# Patient Record
Sex: Male | Born: 1975 | Race: White | Hispanic: No | Marital: Married | Smoking: Current some day smoker
Health system: Southern US, Community
[De-identification: ages and names within clinical notes are randomized; demographics above are authoritative.]

## PROBLEM LIST (undated history)

## (undated) DIAGNOSIS — F419 Anxiety disorder, unspecified: Secondary | ICD-10-CM

## (undated) DIAGNOSIS — J45909 Unspecified asthma, uncomplicated: Secondary | ICD-10-CM

## (undated) DIAGNOSIS — F329 Major depressive disorder, single episode, unspecified: Secondary | ICD-10-CM

## (undated) DIAGNOSIS — H409 Unspecified glaucoma: Secondary | ICD-10-CM

## (undated) DIAGNOSIS — F32A Depression, unspecified: Secondary | ICD-10-CM

## (undated) HISTORY — DX: Major depressive disorder, single episode, unspecified: F32.9

## (undated) HISTORY — DX: Depression, unspecified: F32.A

## (undated) HISTORY — DX: Unspecified asthma, uncomplicated: J45.909

---

## 1998-10-18 ENCOUNTER — Emergency Department (HOSPITAL_COMMUNITY): Admission: EM | Admit: 1998-10-18 | Discharge: 1998-10-18 | Payer: Self-pay | Admitting: Emergency Medicine

## 1999-06-03 ENCOUNTER — Encounter: Admission: RE | Admit: 1999-06-03 | Discharge: 1999-06-03 | Payer: Self-pay | Admitting: Family Medicine

## 1999-06-18 ENCOUNTER — Encounter: Admission: RE | Admit: 1999-06-18 | Discharge: 1999-06-18 | Payer: Self-pay | Admitting: Family Medicine

## 2000-08-29 ENCOUNTER — Emergency Department (HOSPITAL_COMMUNITY): Admission: EM | Admit: 2000-08-29 | Discharge: 2000-08-29 | Payer: Self-pay | Admitting: Emergency Medicine

## 2000-09-08 ENCOUNTER — Emergency Department (HOSPITAL_COMMUNITY): Admission: EM | Admit: 2000-09-08 | Discharge: 2000-09-08 | Payer: Self-pay | Admitting: Emergency Medicine

## 2001-05-08 ENCOUNTER — Inpatient Hospital Stay (HOSPITAL_COMMUNITY): Admission: EM | Admit: 2001-05-08 | Discharge: 2001-05-18 | Payer: Self-pay | Admitting: *Deleted

## 2001-05-21 ENCOUNTER — Inpatient Hospital Stay (HOSPITAL_COMMUNITY): Admission: EM | Admit: 2001-05-21 | Discharge: 2001-05-23 | Payer: Self-pay | Admitting: *Deleted

## 2003-07-07 ENCOUNTER — Emergency Department (HOSPITAL_COMMUNITY): Admission: EM | Admit: 2003-07-07 | Discharge: 2003-07-08 | Payer: Self-pay | Admitting: Emergency Medicine

## 2004-04-02 ENCOUNTER — Emergency Department (HOSPITAL_COMMUNITY): Admission: EM | Admit: 2004-04-02 | Discharge: 2004-04-02 | Payer: Self-pay | Admitting: Emergency Medicine

## 2005-04-26 ENCOUNTER — Emergency Department (HOSPITAL_COMMUNITY): Admission: EM | Admit: 2005-04-26 | Discharge: 2005-04-26 | Payer: Self-pay | Admitting: Emergency Medicine

## 2008-12-20 ENCOUNTER — Emergency Department: Payer: Self-pay | Admitting: Internal Medicine

## 2009-07-21 ENCOUNTER — Emergency Department: Payer: Self-pay | Admitting: Emergency Medicine

## 2010-03-31 ENCOUNTER — Emergency Department (HOSPITAL_COMMUNITY): Admission: EM | Admit: 2010-03-31 | Discharge: 2010-03-31 | Payer: Self-pay | Admitting: Emergency Medicine

## 2010-05-06 ENCOUNTER — Ambulatory Visit: Payer: Self-pay | Admitting: Family Medicine

## 2010-05-06 DIAGNOSIS — R634 Abnormal weight loss: Secondary | ICD-10-CM | POA: Insufficient documentation

## 2010-05-06 DIAGNOSIS — J019 Acute sinusitis, unspecified: Secondary | ICD-10-CM | POA: Insufficient documentation

## 2010-05-07 ENCOUNTER — Encounter: Payer: Self-pay | Admitting: Family Medicine

## 2010-05-07 LAB — CONVERTED CEMR LAB
Albumin: 5 g/dL (ref 3.5–5.2)
Alkaline Phosphatase: 50 units/L (ref 39–117)
BUN: 7 mg/dL (ref 6–23)
Basophils Absolute: 0 10*3/uL (ref 0.0–0.1)
Basophils Relative: 0 % (ref 0–1)
Calcium: 10 mg/dL (ref 8.4–10.5)
Creatinine, Ser: 0.74 mg/dL (ref 0.40–1.50)
Eosinophils Absolute: 0.1 10*3/uL (ref 0.0–0.7)
Eosinophils Relative: 1 % (ref 0–5)
Glucose, Bld: 95 mg/dL (ref 70–99)
HCT: 45.4 % (ref 39.0–52.0)
Hemoglobin: 15.2 g/dL (ref 13.0–17.0)
Lymphocytes Relative: 26 % (ref 12–46)
MCHC: 33.5 g/dL (ref 30.0–36.0)
MCV: 100 fL (ref 78.0–100.0)
Monocytes Absolute: 0.4 10*3/uL (ref 0.1–1.0)
Platelets: 243 10*3/uL (ref 150–400)
Potassium: 4.7 meq/L (ref 3.5–5.3)
RDW: 13.7 % (ref 11.5–15.5)

## 2010-05-12 ENCOUNTER — Ambulatory Visit: Payer: Self-pay | Admitting: Family Medicine

## 2010-07-28 ENCOUNTER — Emergency Department (HOSPITAL_COMMUNITY): Admission: EM | Admit: 2010-07-28 | Discharge: 2010-07-28 | Payer: Self-pay | Admitting: Emergency Medicine

## 2010-08-03 ENCOUNTER — Emergency Department: Payer: Self-pay | Admitting: Emergency Medicine

## 2011-01-28 NOTE — Letter (Signed)
Summary: Out of Work  [facility]  [address]   [place], [place] [address]   Phone: [phone]  Fax: [phone]    May 06, 2010   Employee:  LEA WALBERT    To Whom It May Concern:   For Medical reasons, please excuse the above named employee from work for the following dates:  Start:   05/06/2010  Return 05/08/2010:    If you need additional information, please feel free to contact our office.         Sincerely,    Hassan Rowan MD

## 2011-01-28 NOTE — Assessment & Plan Note (Signed)
Summary: DIARREAH, VOMITING, WEAKNESS   Vital Signs:  Patient Profile:   35 Years Old Male CC:      No appetite, diarrhea, nausea, congestion, vomiting, shaking x 1 month Height:     72 inches Weight:      190 pounds O2 Sat:      99 % O2 treatment:    Room Air Temp:     96.9 degrees F oral Pulse rate:   79 / minute Pulse rhythm:   regular Resp:     18 per minute BP sitting:   142 / 83  (right arm) Cuff size:   regular  Vitals Entered By: Emilio Math (May 06, 2010 12:09 PM)             CBG Result 119      Prior Medication List:  No prior medications documented  Current Allergies: No known allergies History of Present Illness Chief Complaint: No appetite, diarrhea, nausea, congestion, vomiting, shaking x 1 month History of Present Illness: Patient reports  not eating gfor the last month . Having  nausea and diarrhea for the last 2 days which is worse. He also reports having shaking and chills as well. He reports having lots of stress.   GM died of unkown cancer.  Patient does report feeliing depressed.  Does not have a regular doctor.  Not sleeping well at all. No intrest in day to day activities.  Has had increase congestion and gagging last 2 days.  Current Problems: WEIGHT LOSS, RECENT (ICD-783.21) DEPRESSION (ICD-311) ACUTE SINUSITIS, UNSPECIFIED (ICD-461.9) UPPER RESPIRATORY INFECTION, ACUTE (ICD-465.9)   Current Meds AUGMENTIN 875-125 MG TABS (AMOXICILLIN-POT CLAVULANATE) 1 by mouth 2 times daily ZYRTEC-D ALLERGY & CONGESTION 5-120 MG XR12H-TAB (CETIRIZINE-PSEUDOEPHEDRINE) sig 1 by mouth 2x a day * LEXAPRO 20 sig 1 by mouth q day PROMETHAZINE HCL 25 MG  TABS (PROMETHAZINE HCL) sig 1 by mouth q 6-8hrs as needed  REVIEW OF SYSTEMS Constitutional Symptoms       Complains of weight loss and fatigue.     Denies fever, chills, night sweats, and weight gain.      Comments: 30lbs in 1 month Eyes       Denies change in vision, eye pain, eye discharge, glasses,  contact lenses, and eye surgery. Ear/Nose/Throat/Mouth       Complains of sinus problems.      Denies hearing loss/aids, change in hearing, ear pain, ear discharge, dizziness, frequent runny nose, frequent nose bleeds, sore throat, hoarseness, and tooth pain or bleeding.  Respiratory       Denies dry cough, productive cough, wheezing, shortness of breath, asthma, bronchitis, and emphysema/COPD.  Cardiovascular       Denies murmurs, chest pain, and tires easily with exhertion.    Gastrointestinal       Complains of nausea/vomiting and diarrhea.      Denies stomach pain, constipation, blood in bowel movements, and indigestion. Genitourniary       Denies painful urination, kidney stones, and loss of urinary control. Neurological       Denies paralysis, seizures, and fainting/blackouts.      Comments: Shaking Musculoskeletal       Denies muscle pain, joint pain, joint stiffness, decreased range of motion, redness, swelling, muscle weakness, and gout.  Skin       Denies bruising, unusual mles/lumps or sores, and hair/skin or nail changes.  Psych       Complains of anxiety/stress and depression.      Denies mood changes, temper/anger  issues, speech problems, and sleep problems. Other Comments: denies any pain he has been drinking lots of tea and occasional Mountain Dew   Past History:  Family History: Last updated: 05/06/2010 Mother, PE Father, Healthy  Social History: Last updated: 05/06/2010 1 ppd, 19 yrs No ETOH Occasional Marijuana AC  Past Medical History: Unremarkable  Past Surgical History: Denies surgical history  Family History: Reviewed history and no changes required. Mother, PE Father, Healthy  Social History: Reviewed history and no changes required. 1 ppd, 19 yrs No ETOH Occasional Marijuana AC Physical Exam General appearance: well developed, well nourished,   sad affect Head: normocephalic, atraumatic Ears: normal, no lesions or deformities Nasal:  pale, boggy, swollen nasal turbinates Oral/Pharynx: tongue normal, posterior pharynx without erythema or exudate Neck: neck supple,  trachea midline, no masses Chest/Lungs: no rales, wheezes, or rhonchi bilateral, breath sounds equal without effort Heart: regular rate and  rhythm, no murmur Abdomen: soft, non-tender without obvious organomegaly Skin: no obvious rashes or lesions MSE: oriented to time, place, and person                      Assessment New Problems: WEIGHT LOSS, RECENT (ICD-783.21) DEPRESSION (ICD-311) ACUTE SINUSITIS, UNSPECIFIED (ICD-461.9) UPPER RESPIRATORY INFECTION, ACUTE (ICD-465.9)  siniusitis  nausea and vomiting  weight loss  Patient Education: Patient and/or caregiver instructed in the following: rest fluids and Tylenol.  Plan New Medications/Changes: PROMETHAZINE HCL 25 MG  TABS (PROMETHAZINE HCL) sig 1 by mouth q 6-8hrs as needed  #12 x 0, 05/06/2010, Hassan Rowan MD LEXAPRO 20 sig 1 by mouth q day  #30 x 0, 05/06/2010, Hassan Rowan MD ZYRTEC-D ALLERGY & CONGESTION 5-120 MG XR12H-TAB (CETIRIZINE-PSEUDOEPHEDRINE) sig 1 by mouth 2x a day  #30 x 0, 05/06/2010, Hassan Rowan MD AUGMENTIN [phone] MG TABS (AMOXICILLIN-POT CLAVULANATE) 1 by mouth 2 times daily  #20 x 0, 05/06/2010, Hassan Rowan MD  New Orders: New Patient Level IV [99204] T-Comprehensive Metabolic Panel [[id number]] T-CBC w/Diff [[id number]] T-TSH [[id number]] Follow Up: Follow up in 1-2 days if no improvement, Follow up with Primary Physician Work/School Excuse: Return to work/school in 2 days  The patient and/or caregiver has been counseled thoroughly with regard to medications prescribed including dosage, schedule, interactions, rationale for use, and possible side effects and they verbalize understanding.  Diagnoses and expected course of recovery discussed and will return if not improved as expected or if the condition worsens. Patient and/or caregiver verbalized  understanding.  Prescriptions: PROMETHAZINE HCL 25 MG  TABS (PROMETHAZINE HCL) sig 1 by mouth q 6-8hrs as needed  #12 x 0   Entered and Authorized by:   Hassan Rowan MD   Signed by:   Hassan Rowan MD on 05/06/2010   Method used:   Print then Give to Patient   RxID:   [id number] LEXAPRO 20 sig 1 by mouth q day  #30 x 0   Entered and Authorized by:   Hassan Rowan MD   Signed by:   Hassan Rowan MD on 05/06/2010   Method used:   Print then Give to Patient   RxID:   [id number] ALLERGY & CONGESTION 5-120 MG XR12H-TAB (CETIRIZINE-PSEUDOEPHEDRINE) sig 1 by mouth 2x a day  #30 x 0   Entered and Authorized by:   Hassan Rowan MD   Signed by:   Hassan Rowan MD on 05/06/2010   Method used:   Print then Give to Patient   RxID:   [id number] AUGMENTIN 875-125 MG TABS (AMOXICILLIN-POT CLAVULANATE) 1  by mouth 2 times daily  #20 x 0   Entered and Authorized by:   Hassan Rowan MD   Signed by:   Hassan Rowan MD on 05/06/2010   Method used:   Print then Give to Patient   RxID:   [id number]   Patient Instructions: 1)  Please schedule an appointment with your primary doctor in :1-2 weeks 2)  Will give referal to FP group in this bulding  may need more extensive testing to evaluate weight loss 3)  Drink clear liquids only for the next 24 hours, then slowly add other liquids and food as you  tolerate them. 4)  Tobacco is very bad for your health and your loved ones! You Should stop smoking!. 5)  Stop Smoking Tips: Choose a Quit date. Cut down before the Quit date. decide what you will do as a substitute when you feel the urge to smoke(gum,toothpick,exercise). 6)  You need to lose weight. Consider a lower calorie diet and regular exercise.  7)  Take your antibiotic as prescribed until ALL of it is gone, but stop if you develop a rash or swelling and contact our office as soon as possible. 8)  Acute sinusitis symptoms for less than 10 days are not helped by antibiotics.Use warm moist  compresses, and over the counter decongestants ( only as directed). Call if no improvement in 5-7 days, sooner if increasing pain, fever, or new symptoms. 9)  Recommended remaining out of work for next 2 days

## 2011-03-17 ENCOUNTER — Inpatient Hospital Stay (INDEPENDENT_AMBULATORY_CARE_PROVIDER_SITE_OTHER)
Admission: RE | Admit: 2011-03-17 | Discharge: 2011-03-17 | Disposition: A | Discharge status: Home / Self Care | Payer: Medicaid Other | Source: Ambulatory Visit | Attending: Emergency Medicine | Admitting: Emergency Medicine

## 2011-03-17 ENCOUNTER — Encounter: Payer: Self-pay | Admitting: Emergency Medicine

## 2011-03-17 DIAGNOSIS — M25519 Pain in unspecified shoulder: Secondary | ICD-10-CM | POA: Insufficient documentation

## 2011-03-18 ENCOUNTER — Encounter: Payer: Self-pay | Admitting: Emergency Medicine

## 2011-03-25 NOTE — Letter (Signed)
Summary: Internal Correspondence  Internal Correspondence   Imported By: Dannette Barbara 03/18/2011 08:44:54  _____________________________________________________________________  External Attachment:    Type:   Image     Comment:   External Document

## 2011-03-25 NOTE — Letter (Signed)
Summary: External Correspondence  External Correspondence   Imported By: Dannette Barbara 03/18/2011 08:45:58  _____________________________________________________________________  External Attachment:    Type:   Image     Comment:   External Document

## 2011-03-25 NOTE — Assessment & Plan Note (Signed)
Summary: SICK Rm 5   Vital Signs:  Patient Profile:   35 Years Old Male CC:      Left shoulder pain x 3 days Height:     72 inches Weight:      190 pounds O2 Sat:      98 % O2 treatment:    Room Air Temp:     98.3 degrees F oral Pulse rate:   75 / minute Pulse rhythm:   regular Resp:     12 per minute BP sitting:   124 / 80  (left arm) Cuff size:   regular  Pt. in pain?   yes    Location:   shoulder    Intensity:   8    Type:       burning  Vitals Entered By: Emilio Math (March 17, 2011 5:07 PM)                   Current Allergies: No known allergies History of Present Illness Chief Complaint: Left shoulder pain x 3 days History of Present Illness: L shoulder pain for 3 days.  Located posterior back near shoulder blade.  Doesn't recall any trauma, falls, heavy lifting.  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  Also has 1 1/2 old daughter who he frequently picks up.  No radiation of pain.  Never had shoulder pain before.  REVIEW OF SYSTEMS Constitutional Symptoms      Denies fever, chills, night sweats, weight loss, weight gain, and fatigue.  Eyes       Denies change in vision, eye pain, eye discharge, glasses, contact lenses, and eye surgery. Ear/Nose/Throat/Mouth       Denies hearing loss/aids, change in hearing, ear pain, ear discharge, dizziness, frequent runny nose, frequent nose bleeds, sinus problems, sore throat, hoarseness, and tooth pain or bleeding.  Respiratory       Denies dry cough, productive cough, wheezing, shortness of breath, asthma, bronchitis, and emphysema/COPD.  Cardiovascular       Denies murmurs, chest pain, and tires easily with exhertion.    Gastrointestinal       Denies stomach pain, nausea/vomiting, diarrhea, constipation, blood in bowel movements, and indigestion. Genitourniary       Denies painful urination, kidney stones, and loss of urinary control. Neurological       Denies paralysis,  seizures, and fainting/blackouts. Musculoskeletal       Complains of muscle pain, joint pain, joint stiffness, and decreased range of motion.      Denies redness, swelling, muscle weakness, and gout.  Skin       Denies bruising, unusual mles/lumps or sores, and hair/skin or nail changes.  Psych       Denies mood changes, temper/anger issues, anxiety/stress, speech problems, depression, and sleep problems.  Past History:  Past Medical History: Reviewed history from 05/06/2010 and no changes required. Unremarkable  Past Surgical History: Reviewed history from 05/06/2010 and no changes required. Denies surgical history  Family History: Reviewed history from 05/06/2010 and no changes required. Mother, PE Father, Healthy  Social History: Reviewed history from 05/06/2010 and no changes required. 1 ppd, 19 yrs No ETOH Occasional Marijuana AC Physical Exam General appearance: well developed, well nourished, no acute distress MSE: oriented to time, place, and person L Shoulder: Inspection reveals spasms medial and superior to L scapula.  Palpation with tenderness over levator scapulae and rhomboid muscles. No tenderness over AC, [place], bicipital groove, acromion, and coracoid,  or his scapula bone.  ROM is full in all planes. Rotator cuff strength normal throughout. Mild scapular dyskinesis.No wasting.  Spurling negative.  Distal NV status intact. Assessment New Problems: SHOULDER PAIN, LEFT (ICD-719.41)  Scapular dysfunction secondary to rhomboid strain/spasm  Patient Education: Patient and/or caregiver instructed in the following: rest, fluids.  Plan New Medications/Changes: TRAMADOL HCL 50 MG TABS (TRAMADOL HCL) 1 by mouth q 6 hrs as needed pain  #24 x 0, 03/17/2011, Hoyt Koch MD PREDNISONE (PAK) 10 MG TABS (PREDNISONE) 6 day pack, use as directed  #1 x 0, 03/17/2011, Hoyt Koch MD FLEXERIL 10 MG TABS (CYCLOBENZAPRINE HCL) 1 by mouth three times a day as needed  spasms  #15 x 0, 03/17/2011, Hoyt Koch MD  New Orders: Est. Patient Level IV [47829] Planning Comments:   Take meds as directed.  Heating pad encouraged.  Gentle massage and gentle ROM.  Avoid heavy lifting or overhead activities.  Would adjust his computer and chair so that he can type comfortably.  I have informed him that it will take at least a few days to start improving, but If not improving in a week, refer to sports med or back ortho. Due to having Medicaid, hold off on PT for now since has only a small limited number of sessions per year.   The patient and/or caregiver has been counseled thoroughly with regard to medications prescribed including dosage, schedule, interactions, rationale for use, and possible side effects and they verbalize understanding.  Diagnoses and expected course of recovery discussed and will return if not improved as expected or if the condition worsens. Patient and/or caregiver verbalized understanding.  Prescriptions: TRAMADOL HCL 50 MG TABS (TRAMADOL HCL) 1 by mouth q 6 hrs as needed pain  #24 x 0   Entered and Authorized by:   Hoyt Koch MD   Signed by:   Hoyt Koch MD on 03/17/2011   Method used:   Print then Give to Patient   RxID:   [id number] PREDNISONE (PAK) 10 MG TABS (PREDNISONE) 6 day pack, use as directed  #1 x 0   Entered and Authorized by:   Hoyt Koch MD   Signed by:   Hoyt Koch MD on 03/17/2011   Method used:   Print then Give to Patient   RxID:   [id number] FLEXERIL 10 MG TABS (CYCLOBENZAPRINE HCL) 1 by mouth three times a day as needed spasms  #15 x 0   Entered and Authorized by:   Hoyt Koch MD   Signed by:   Hoyt Koch MD on 03/17/2011   Method used:   Print then Give to Patient   RxID:   [id number]   Orders Added: 1)  Est. Patient Level IV [38756]

## 2011-05-14 NOTE — H&P (Signed)
[facility]  Patient:    [name], [name]                       MRN: [id number] Adm. Date:  [id number] Attending:  Jasmine Pang Dictator:   Candi Leash. Orsini, N.P.                         History and Physical  HISTORY OF PRESENT ILLNESS:  The patient was admitted on May 08, 2001, for depression and suicidal ideation.  REVIEW OF SYSTEMS:  Over the past year, the patient reports many somatic complaints, having fever and chills for about a year, having change in his appetite where sometimes he does not eat at all during the day.  He states he makes sure that his children are fed first before he eats, although he does report no weight gain or weight loss.  He states he feels like his "words blend together."  It has been a long time since he has had an eye exam.  He believes he needs glasses.  He states he is constantly sick, having a lot of colds and sore throats.  He also reports some chest pain two to three times a day that last about an hour, stabbing type chest pain with some numbness going down both his arms.  He states he has never had a workup.  Respiratory: He smokes.  He states he has a cough and some shortness of breath.  He feels like he cannot breathe.  GI: He reports every time he eats he needs to lie down because he is having abdominal pain.  He says he likes to eat a lot of greasy foods.  He denies any blood in his stool or changes in habits, constipation or diarrhea.  No dysuria, frequency, or hematuria.  Musculoskeletal: He reports his hands "lock up, excruciating pain" when he is trying to hammer; he can only do it for a short time, about 10 minutes.  He needs to sit down.  Again, he states has never had any evaluation in regard to that.  Skin: He states he takes a long time to heal.  He denies any bruising or rashes.  Neurologic: He reports a history of migraine headaches for about a year, no memory loss or seizures.  The patient also  states that he also feels like he gets dizzy all the time.  Psychiatric: History of depression since separation from his wife. Denies any thyroid problems.  Lymph: No enlarged or tender nodes or anemia. Allergies: No environmental allergies.  PHYSICAL EXAMINATION:  Vital signs: Weight 159 pounds, height 6 feet 2 inches, temperature 97.9, pulse 74, respirations 20, blood pressure 135/80.  General: A 35 year old separated Caucasian male sitting on the exam table in no acute distress.  Appears well-developed and appears his stated age.  Neat, alert, and cooperative.  Head is normocephalic, can raise eyebrows.  Eyes: Pupils equal and reactive to light.  EOMs are intact bilaterally.  Funduscopic exam is within normal limits.  External ear canals are patent.  TMs are intact. Hearing is adequate for conversation.  No sinus tenderness, no nasal discharge.  Mouth: Mucosa is moist, fair to poor dentition, has some rotten teeth in his lower molars.  Tongue protrudes to midline without tremor.  Can clench teeth and puff out cheeks.  No pharyngeal exudate, pharynx appears within normal limits.  Uvula is midline.  Neck is supple, full range of  motion, no JVD, negative lymphadenopathy.  Thyroid is nonpalpable and nontender.  Trachea is midline.  Respiratory: Clear to auscultation, no adventitious sounds, no cough was noted.  Cardiac: Regular rate and rhythm without murmurs, gallops, or rubs.  Carotid pulses are equal and adequate, no carotid bruits auscultated.  Mild left edema from an injury to his left calf within about a week.  Abdomen: Flat, soft, nontender abdomen, no masses or organomegaly, active bowel sounds, no CVA tenderness.  Musculoskeletal: No joint swelling or deformity, good range of motion, muscle strength and tone is equal bilaterally.  ACE wrap to the patients left lower calf, good pedal pulse, toes are nice and warm, some mild ankle swelling there.  Skin is warm and dry, good turgor.   Nail beds are pink with good capillary refill. Neurologic: Oriented x 3.  Cranial nerves are grossly intact.  Deep tendon reflexes are 2+, equal, and adequate.  Good grip strength bilaterally, no involuntary movement.  Gait: The patient presents with a limp from his injury to his left calf.  Cerebellar function is intact with finger-to-finger and normal alternating movements.  Romberg is negative.  Health maintenance issues were addressed in regard to eye exams, dental care, nutrition, and smoking cessation. DD:  05/10/01 TD:  05/10/01 Job: [id number]
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
## 2011-05-14 NOTE — H&P (Signed)
[facility]  Patient:    [name], [name]                       MRN: [id number] Adm. Date:  [id number] Disc. Date: [id number] Attending:  Shelba Flake Dictator:   Candi Leash. Orsini, N.P.                   Psychiatric Admission Assessment  IDENTIFYING INFORMATION:  This 35 year old separated white male was admitted on May 26 for depression and suicide ideation.  HISTORY OF PRESENT ILLNESS:  The patient presents with a history of recurrent depression and suicide ideation since discharge on Thursday, May 19, 2001.  DISCHARGE PLANS:  The patient was to live apparently with friends after discharge, but they had told him that they would be out of town until Monday, so the patient had to go back and live with his mother.  The patient states that he had an argument with her.  He would not specify as to what it was about.  He went and took 15-20 Advil on Thursday around 10 p.m. only to relieve his toothache pain, not as a suicide attempt.  The patient states he slept.  The next morning he went to go visit some friends.  The patient has been socializing with two patients that he met at [facility] with this last hospitalization and has been with them over the weekend.  He did tell one friend on Sunday morning that he was still having thoughts of hurting himself with his plan to make his death look like "an accident."  The patient had no specific plan in regards to that.  The patient states he took four aspirin and two Valium that morning in an attempt to help with his toothache pain, not as an overdose.  He does report his sleep has been decreased.  He has been having nightmares, decreased appetite.  He states he has had one meal over the past four days.  His children are presently with his mother.  PAST PSYCHIATRIC HISTORY:  In 1995 he was in the step one program, in [facility] in May of 2001, and he had a 10-day hospital stay at [facility] from May 13 to May 23 for depression.  SOCIAL HISTORY:  He is a 35 year old separated white male, has been separated for one year.  He has been married for six years.  He has three children, ages 3, 41, and 2.  He has sole custody of the children, as his wife is a drug addict.  He has been unemployed for the past eight months.  He has completed his GED.  He has been attending alcohol assessment classes for DUI in September 2001.  FAMILY HISTORY:  Father is alcoholic.  ALCOHOL AND DRUG HISTORY:  He smokes two packs a day.  He denies any alcohol abuse.  He states he used marijuana over the weekend.  He states he did not actually go out and get it; it was there.  He denies any other substance abuse.  PRIMARY CARE Shaterria Sager:  Dr. Toni Arthurs.  MEDICAL PROBLEMS:  None.  MEDICATIONS:  The patient is uncertain of his dosages, but states he is on Trazodone, Celexa, Depakote, and Risperdal.  He states he has been very dizzy with the medicine and has had several falls that he relates to this medication.  DRUG ALLERGIES:  No known drug allergies.  PHYSICAL EXAMINATION:  Physical exam was performed at the  last hospitalization, also at emergency room visit at [facility].  His BUN was less than 5.  Urinalysis within normal limits.  His urine drug screen was positive for THC, positive for benzodiazepines.  The patient is presently complaining of a toothache.  MENTAL STATUS EXAMINATION:  He is an alert, young, developed, Caucasian male. He is dressed casually, cooperative.  Speech is normal and relevant.  Mood is depressed.  Affect is appropriate to mood.  Thought processes are coherent. There is no evidence of psychosis, no current suicide ideation.  He promises safety.  No homicidal ideation, no paranoid, no auditory or visual hallucinations.  Cognitive functioning is intact.  Memory is fair.  Judgment is impaired.  Insight is poor.  He has poor impulse control.  DIAGNOSES: Axis I:     1. Major depression, recurrent.            2. Cannabis abuse. Axis II:   Deferred. Axis III:  None. Axis IV:   Moderate, problems relating to primary support group, occupation,            housing. Axis V:    Current is 35, this past year is 70.  PLAN:  Voluntary admission to [facility] for depression and suicide ideation, contract for safety, check every 15 minutes.  The patient agrees to be safe.  He will resume his routine medications, will obtain labs, urine drug screen, and CBC.  Our plan is to return him to his arrangement that he made with his last discharge and to decrease his depressive symptoms so the patient can be safe.  We plan to increase his coping skills so the patient can manage these situations.  Tentative length of stay is 3-5 days. DD:  05/22/01 TD:  05/22/01 Job: [id number]

## 2011-05-14 NOTE — Discharge Summary (Signed)
[facility]  Patient:    [name], [name]                       MRN: [id number] Adm. Date:  [id number] Disc. Date: [id number] Attending:  Geoffery Lyons A Dictator:   Candi Leash. Orsini, N.P.                           Discharge Summary  HISTORY OF PRESENT ILLNESS:  This is a 35 year old separated white male who was admitted for depression and suicidal ideation.  The patient reports he has been having his recurrent depressive symptoms and suicidal ideation since his discharge on Thursday, May 19, 2001.  The patient apparently was to live with his friends after discharge but they told him that they would be unavailable until Monday of Memorial Day. The patient states he had gone back to live with his mother and had a conflict and then went to stay with some friends.  The patient reportedly took 15 to 20 Advils on Thursday at 1:10 p.m. to relieve some toothache pain, not as a suicide attempt.  Over the weekend he has been socializing with a couple of patients that he had met at [facility] that he had gotten close to.  He reported that on Sunday morning he was having thoughts of hurting himself and his plan was to make his death look like "an accident."  He had no specific plan in mind though. The patient did take four aspirin and two Valium on day of admission only to relieve toothache pain, not as an overdose.  He reports his sleep has been decreased, he has been having nightmares, and decreased appetite.  He states he has eaten only one meal over the past four days.  PAST PSYCHIATRIC HISTORY:  The patient had a 10-day hospital stay at [facility] on May 08, 2001, and discharged on May 18, 2001, for depression and suicidal ideation.  He has also had other hospitalizations in [facility] with an inpatient stay in May 2001 and in 1995 at [facility].  PAST MEDICAL HISTORY:  Primary care [name] is Onalee Hua L. Toni Arthurs, [name]  He has no medical  problems.  DISCHARGE MEDICATIONS:  Trazodone, Celexa, Depakote and Risperdal.  ALLERGIES:  No known drug allergies.  PHYSICAL EXAMINATION:  This was performed on his last hospitalization.  LABS THIS ADMISSION:  BUN less than 5.  Urinalysis is within normal limits. Urine drug screen was positive for THC and positive for benzodiazepines.  MENTAL STATUS EXAMINATION:  He is an alert, young, well-developed Caucasian male.  He is dressed casually but he is very cooperative. Speech is normal and relevant.  Mood is depressed.  Affect is appropriate to mood.  Thought processes are coherent.  There is no evidence of psychosis. There is no current homicidal or suicidal ideations.  No auditory or visual hallucinations, no paranoid ideation.  Cognitive function is intact.  Memory is fair.  Judgement is impaired.  Insight is poor.  Poor impulse control.  ADMISSION DIAGNOSES: AXIS I.   1. Major depression, recurrent.           2. Cannabis abuse. AXIS II.  Deferred. AXIS III. None. AXIS IV.  Moderate psychosocial stressors related to primary support group,           occupation and housing. AXIS V.   Current is 48, this past year was 70.  HOSPITAL COURSE:  This was a voluntary admission for depression and suicidal ideation.  The patient will be monitored every 15 minutes.  The patient does agree to be safe on the unit.  Will resume his routine medications and obtain further labs.  Our intention was to have the patient return to his friends for living arrangements and to decrease his depressive symptoms so the patient could be safe.  We increased his Celexa to 60 mg per day, hoping to decrease his depressive symptoms.  The patient was tolerating that well.  The patient did have an episode of some anxiety and toothache pain and was given some Ativan and Darvocet.  CONDITION ON DISCHARGE:  The patient was in full contact with reality.  He was much improved.  He reported that he felt overwhelmed  when he was discharged. The patient will be discharged to stay with his parents and reports that the relationship had improved and he will follow up with [facility].  It was felt that the patient could be managed on an outpatient basis.  He was denying any homicidal or suicidal ideations and will be discharged with his parents.  FOLLOW-UP:  The patient will follow up at [facility] in [place], [place]. An appointment was made on May 26, 2001, at 11 a.m. in the hospital reentry.  Address was provided to the patient.  ACTIVITY:  No restrictions.  DIET:  No restrictions.  DISCHARGE MEDICATIONS: 1. Cogentin 1 mg b.i.d. 2. Celexa 60 mg q.d. 3. Risperdal 0.25 mg one b.i.d. 4. Depakote ER 500 mg q.h.s. 5. Darvocet-N 100 one every eight hours p.r.n. for pain.  A prescription for    12 is provided.  DISCHARGE DIAGNOSES: AXIS I.   1. Major depression, recurrent.           2. Cannabis abuse. AXIS II.  Deferred. AXIS III. None. AXIS IV.  Moderate psychosocial stressors. AXIS V.   Current is 51, this past year was 70. DD:  05/23/01 TD:  05/23/01 Job: [id number]

## 2011-05-14 NOTE — H&P (Signed)
[facility]  Patient:    [name], [name]                       MRN: [id number] Adm. Date:  [id number] Attending:  Jasmine Pang Dictator:   Candi Leash. Orsini, N.P.                         History and Physical  HISTORY OF PRESENT ILLNESS:  The patient was admitted on May 08, 2001, for depression and suicidal ideation.  REVIEW OF SYSTEMS:  The patient reports having many different somatic complaints, especially over the past year when he separated from his wife.  He reports he has been having some fever and chills for about a year with changes in his appetite.  He states he makes sure that his children are fed first then he eats, sometimes not eating at all on a daily basis but he reports no weight change.  He reports some difficulty with his vision, he feels like the words blend together.  He states he has never had an eye exam, maybe when he had his last drivers license test. DD:  05/10/01 TD:  05/10/01 Job: [id number]

## 2011-05-14 NOTE — H&P (Signed)
[facility]  Patient:    [name], [name]                       MRN: [id number] Adm. Date:  [id number] Attending:  Milford Cage H Dictator:   Landry Corporal, N.P.                   Psychiatric Admission Assessment  IDENTIFYING INFORMATION:  This is a 35 year old separated white male voluntarily admitted on May 07, 2001, for depression and suicidal ideation.  HISTORY OF PRESENT ILLNESS:  Patient presents with a history of depression, feeling very helpless, overwhelmed, with no energy and anhedonia.  Patient has been having suicidal ideation for several weeks.  Patient did overdose on Effexor 36 tablets on Saturday night.  Patient stated he was having seizures during the night, totalling 5.  Patient did go to his family medical doctor about a month ago for depressive symptoms and was placed on Effexor at that time.  Patient called his primary care Shariece Viveiros after the overdose and she had advised him to go to [facility].  Patient reports he has been having significant stressors in his life, separation from his wife.  He states his wife has been sleeping with all of his friends.  She calls him at home and talks about her new boyfriend.  Patient is also on unemployment.  He lost his apartment from inability to pay his rent.  He also finds he receives little support from his family and friends.  His friends encourage him to use marijuana when patient does not care to do so.  Patient sleeps poorly.  He states he has a decreased appetite, although there is not report of weight loss.  He denies any auditory or visual hallucinations or paranoia.  Patient continues with having problems with suicidal ideation, no specific plan, and will contract for safety on the unit.  Patient also feels somewhat angry about his wife and his boyfriend.  Patient states he wants to get his life back on track but finds he does not even know where to start.  PAST  PSYCHIATRIC HISTORY:  He has been in [facility] in May 2001, and in 1995 was in [facility].  He sees no outpatient therapist or psychiatrist.  SOCIAL HISTORY:  He is a 35 year old separated white male.  He has been separated for 1 year, married for 6 years.  He has 3 children, ages 78, 3 and 2.  Patient lost his job that he states he really enjoyed, because his other coworkers were trying to get him to smoke marijuana where he did not want to. He does have custody of his children.  He states his wife is a drug addict. Because he lost his apartment, he has been living with his mom and dad, has moved there about a week ago.  He has been unemployed for 8 months.  He has completed his GED.  Patient has going 2 days a week for alcohol assessment classes for a DUI in September 2001, and tomorrow he has an appointment for community services.  FAMILY HISTORY:  He has a father who is an alcoholic.  ALCOHOL DRUG HISTORY:  He smokes 2 packs a day.  He denies any alcohol use, and marijuana was used last week.  He states he has no will power when it comes to that.  PAST MEDICAL HISTORY:  Primary care Shia Eber is Dr. Tomi Bamberger.  Medical problems:  He states he  pulled a muscle on his left leg, which occurred Saturday.  Other than that, he states he has no medical problems. Medications:  Patient was Effexor, has been on that for about a month, and overdosed this past weekend.  That was prescribed by Dr. Toni Arthurs.  DRUG ALLERGIES:  No known drug allergies.  PHYSICAL EXAMINATION:  Pending.  Patient appears as a healthy young male.  He does complain of some pain to his left lower leg which he states is a strain.  MENTAL STATUS EXAMINATION:  He is an alert young Caucasian male, dressed neatly, casually dressed, cooperative, good eye contact.  Speech is normal and relevant.  Mood is depressed.  Affect is flat and teary-eyed.  Thought processes are coherent.  There is no evidence of psychosis.  Suicidal  ideation continues, but patient has no plan and will contract for safety.  No homicidal ideation, no auditory or visual hallucinations, no paranoia.  Cognitive function is intact.  His memory is good, judgment is fair, insight is fair, he has poor impulse control.  He appears to be reliable.  ADMISSION DIAGNOSES: Axis I:    1. Major depression.            2. Cannabis abuse. Axis II:   Deferred. Axis III:  None. Axis IV:   Severe, with problems relating to primary support group,            occupation, housing, economics, and problems related to legal            system. Axis V:    Current 30, this past year 21.  INITIAL PLAN OF CARE:  Voluntary admission to [facility] for depression and suicidal ideation, contract for safety, check every 15 minutes. Patient agrees to be safe.  We will initiate an antidepressant to reduce his depressive symptoms.  We will encourage groups.  Patient to elevate and rest his left leg. Plan is to reduce his depressive symptoms so patient can be safe.  TENTATIVE LENGTH OF STAY:  Three to five days. DD:  05/09/01 TD:  05/09/01 Job: [id number]

## 2011-05-20 ENCOUNTER — Inpatient Hospital Stay (INDEPENDENT_AMBULATORY_CARE_PROVIDER_SITE_OTHER)
Admission: RE | Admit: 2011-05-20 | Discharge: 2011-05-20 | Disposition: A | Discharge status: Home / Self Care | Payer: Medicaid Other | Source: Ambulatory Visit | Attending: Family Medicine | Admitting: Family Medicine

## 2011-05-20 ENCOUNTER — Encounter: Payer: Self-pay | Admitting: Family Medicine

## 2011-05-20 DIAGNOSIS — B86 Scabies: Secondary | ICD-10-CM

## 2011-05-20 DIAGNOSIS — J45909 Unspecified asthma, uncomplicated: Secondary | ICD-10-CM | POA: Insufficient documentation

## 2011-05-25 ENCOUNTER — Inpatient Hospital Stay (INDEPENDENT_AMBULATORY_CARE_PROVIDER_SITE_OTHER)
Admission: RE | Admit: 2011-05-25 | Discharge: 2011-05-25 | Disposition: A | Discharge status: Home / Self Care | Payer: Medicaid Other | Source: Ambulatory Visit | Attending: Emergency Medicine | Admitting: Emergency Medicine

## 2011-05-25 ENCOUNTER — Encounter: Payer: Self-pay | Admitting: Emergency Medicine

## 2011-05-25 DIAGNOSIS — L259 Unspecified contact dermatitis, unspecified cause: Secondary | ICD-10-CM

## 2011-06-08 ENCOUNTER — Encounter (INDEPENDENT_AMBULATORY_CARE_PROVIDER_SITE_OTHER): Payer: Self-pay | Admitting: *Deleted

## 2011-06-08 ENCOUNTER — Encounter: Payer: Self-pay | Admitting: Emergency Medicine

## 2011-06-08 ENCOUNTER — Inpatient Hospital Stay (INDEPENDENT_AMBULATORY_CARE_PROVIDER_SITE_OTHER)
Admission: RE | Admit: 2011-06-08 | Discharge: 2011-06-08 | Disposition: A | Discharge status: Home / Self Care | Payer: Medicaid Other | Source: Ambulatory Visit | Attending: Emergency Medicine | Admitting: Emergency Medicine

## 2011-06-08 DIAGNOSIS — R109 Unspecified abdominal pain: Secondary | ICD-10-CM | POA: Insufficient documentation

## 2011-06-08 DIAGNOSIS — K5289 Other specified noninfective gastroenteritis and colitis: Secondary | ICD-10-CM

## 2011-06-08 DIAGNOSIS — R112 Nausea with vomiting, unspecified: Secondary | ICD-10-CM | POA: Insufficient documentation

## 2011-06-08 DIAGNOSIS — K529 Noninfective gastroenteritis and colitis, unspecified: Secondary | ICD-10-CM | POA: Insufficient documentation

## 2011-08-02 ENCOUNTER — Encounter (INDEPENDENT_AMBULATORY_CARE_PROVIDER_SITE_OTHER): Payer: Self-pay | Admitting: *Deleted

## 2011-08-02 ENCOUNTER — Inpatient Hospital Stay: Admission: RE | Admit: 2011-08-02 | Payer: Medicaid Other | Source: Ambulatory Visit | Admitting: Emergency Medicine

## 2011-08-27 DIAGNOSIS — F419 Anxiety disorder, unspecified: Secondary | ICD-10-CM | POA: Insufficient documentation

## 2011-08-27 DIAGNOSIS — R21 Rash and other nonspecific skin eruption: Secondary | ICD-10-CM | POA: Insufficient documentation

## 2011-08-27 DIAGNOSIS — R630 Anorexia: Secondary | ICD-10-CM | POA: Insufficient documentation

## 2011-08-27 DIAGNOSIS — G2581 Restless legs syndrome: Secondary | ICD-10-CM | POA: Insufficient documentation

## 2011-08-27 DIAGNOSIS — G4700 Insomnia, unspecified: Secondary | ICD-10-CM | POA: Insufficient documentation

## 2011-10-11 ENCOUNTER — Encounter: Payer: Self-pay | Admitting: Emergency Medicine

## 2011-10-11 ENCOUNTER — Inpatient Hospital Stay (INDEPENDENT_AMBULATORY_CARE_PROVIDER_SITE_OTHER)
Admission: RE | Admit: 2011-10-11 | Discharge: 2011-10-11 | Disposition: A | Discharge status: Home / Self Care | Payer: Medicaid Other | Source: Ambulatory Visit | Attending: Emergency Medicine | Admitting: Emergency Medicine

## 2011-10-11 ENCOUNTER — Encounter (INDEPENDENT_AMBULATORY_CARE_PROVIDER_SITE_OTHER): Payer: Self-pay | Admitting: *Deleted

## 2011-10-11 DIAGNOSIS — J209 Acute bronchitis, unspecified: Secondary | ICD-10-CM | POA: Insufficient documentation

## 2011-11-29 NOTE — Progress Notes (Signed)
Summary: STOMACH PAIN   Vital Signs:  Patient Profile:   35 Years Old Male CC:      N/V/D x today Height:     72 inches Weight:      184 pounds O2 Sat:      96 % O2 treatment:    Room Air Temp:     98.4 degrees F oral Pulse rate:   81 / minute Resp:     18 per minute BP sitting:   134 / 64  (left arm) Cuff size:   regular  Vitals Entered By: Clemens Catholic LPN (June 08, 2011 6:16 PM)                  Updated Prior Medication List: ALBUTEROL SULFATE (2.5 MG/3ML) 0.083% NEBU (ALBUTEROL SULFATE) prn  Current Allergies (reviewed today): No known allergies History of Present Illness History from: patient Chief Complaint: N/V/D x today History of Present Illness: Abd pain, N/V since 3pm today.  He was told to go home by his boss so he needs a note for today.  Not taking any meds.  He is keeping down fluids [facility]) but threw up his sub.   Nobody else around has been sick.  No F/C.  No URI symptoms. He states that his poison ivy rash is much improved after the steroid shot last week.  REVIEW OF SYSTEMS Constitutional Symptoms      Denies fever, chills, night sweats, weight loss, weight gain, and fatigue.  Eyes       Denies change in vision, eye pain, eye discharge, glasses, contact lenses, and eye surgery. Ear/Nose/Throat/Mouth       Denies hearing loss/aids, change in hearing, ear pain, ear discharge, dizziness, frequent runny nose, frequent nose bleeds, sinus problems, sore throat, hoarseness, and tooth pain or bleeding.  Respiratory       Complains of asthma.      Denies dry cough, productive cough, wheezing, shortness of breath, bronchitis, and emphysema/COPD.  Cardiovascular       Denies murmurs, chest pain, and tires easily with exhertion.    Gastrointestinal       Complains of stomach pain, nausea/vomiting, and diarrhea.      Denies constipation, blood in bowel movements, and indigestion. Genitourniary       Denies painful urination, kidney stones, and loss  of urinary control. Neurological       Denies paralysis, seizures, and fainting/blackouts. Musculoskeletal       Denies muscle pain, joint pain, joint stiffness, decreased range of motion, redness, swelling, muscle weakness, and gout.  Skin       Denies bruising, unusual mles/lumps or sores, and hair/skin or nail changes.  Psych       Denies mood changes, temper/anger issues, anxiety/stress, speech problems, depression, and sleep problems. Other Comments: pt c/o N/V/D and mid abd pain x 3pm today. he also has a rash x 2 mths.   Past History:  Past Medical History: Reviewed history from 05/20/2011 and no changes required.  Asthma  Past Surgical History: Reviewed history from 05/06/2010 and no changes required. Denies surgical history  Family History: Reviewed history from 05/06/2010 and no changes required. Mother, PE Father, Healthy  Social History: Reviewed history from 05/06/2010 and no changes required. 1 ppd, 19 yrs No ETOH Occasional Marijuana AC Physical Exam General appearance: well developed, well nourished, no acute distress Abdomen: ND, NT, +BS, soft, no rebound, no guarding Skin: rash is improved since last visit MSE: oriented to time, place, and  person Assessment New Problems: GASTROENTERITIS (ICD-558.9) NAUSEA AND VOMITING (ICD-787.01) ABDOMINAL PAIN, UNSPECIFIED SITE (ICD-789.00)   Patient Education: Patient and/or caregiver instructed in the following: quit smoking.  Plan New Medications/Changes: PROMETHAZINE HCL 25 MG TABS (PROMETHAZINE HCL) 1 by mouth q6 hrs as needed for nausea  #16 x 0, 06/08/2011, Hoyt Koch MD  New Orders: Est. Patient Level III [phone] Services provided After hours-Weekends-Holidays [99051] Planning Comments:   Mild viral gastroenteritis.  Will treat with Phenergan and bland diet.   Good hand hygeine.  If pain worsens or moves to RLQ, should f/u with PCP or ER.  Encourage hydration with gatorade and water.   The  patient and/or caregiver has been counseled thoroughly with regard to medications prescribed including dosage, schedule, interactions, rationale for use, and possible side effects and they verbalize understanding.  Diagnoses and expected course of recovery discussed and will return if not improved as expected or if the condition worsens. Patient and/or caregiver verbalized understanding.  Prescriptions: PROMETHAZINE HCL 25 MG TABS (PROMETHAZINE HCL) 1 by mouth q6 hrs as needed for nausea  #16 x 0   Entered and Authorized by:   Hoyt Koch MD   Signed by:   Hoyt Koch MD on 06/08/2011   Method used:   Print then Give to Patient   RxID:   [id number]   Orders Added: 1)  Est. Patient Level III [69629] 2)  Services provided After hours-Weekends-Holidays [52841]

## 2011-11-29 NOTE — Progress Notes (Signed)
Summary: POSSIBLE POSION IVY?   Vital Signs:  Patient Profile:   35 Years Old Male CC:      poison ivy Height:     72 inches Weight:      182 pounds O2 Sat:      100 % O2 treatment:    Room Air Temp:     97.5 degrees F oral Pulse rate:   68 / minute Resp:     16 per minute BP sitting:   133 / 79  (left arm) Cuff size:   regular  Vitals Entered By: Lajean Saver RN (May 25, 2011 9:53 AM)                  Updated Prior Medication List: ALBUTEROL SULFATE (2.5 MG/3ML) 0.083% NEBU (ALBUTEROL SULFATE) prn  Current Allergies: No known allergies History of Present Illness History from: patient Chief Complaint: poison ivy History of Present Illness: He was here last week, dx with scabies, used cream and didn't get better.  Went to ER the next day, dx with poison ivy and given cream.  Still spreading and getting worse.  Worst spots are legs but also on back and chest and arms.  Patient would like to have a steroid shot since that has worked in the past.  REVIEW OF SYSTEMS Constitutional Symptoms      Denies fever, chills, night sweats, weight loss, weight gain, and fatigue.  Eyes       Denies change in vision, eye pain, eye discharge, glasses, contact lenses, and eye surgery. Ear/Nose/Throat/Mouth       Denies hearing loss/aids, change in hearing, ear pain, ear discharge, dizziness, frequent runny nose, frequent nose bleeds, sinus problems, sore throat, hoarseness, and tooth pain or bleeding.  Respiratory       Denies dry cough, productive cough, wheezing, shortness of breath, asthma, bronchitis, and emphysema/COPD.  Cardiovascular       Denies murmurs, chest pain, and tires easily with exhertion.    Gastrointestinal       Denies stomach pain, nausea/vomiting, diarrhea, constipation, blood in bowel movements, and indigestion. Genitourniary       Denies painful urination, kidney stones, and loss of urinary control. Neurological       Denies paralysis, seizures, and  fainting/blackouts. Musculoskeletal       Denies muscle pain, joint pain, joint stiffness, decreased range of motion, redness, swelling, muscle weakness, and gout.  Skin       Denies bruising, unusual mles/lumps or sores, and hair/skin or nail changes.  Psych       Denies mood changes, temper/anger issues, anxiety/stress, speech problems, depression, and sleep problems. Other Comments: Patient diagnosed with poison ivy @ ER on Friday. Patient is requesting a streoid injection for the poison ivy. He gets it several times a year.    Past History:  Past Medical History: Reviewed history from 05/20/2011 and no changes required.  Asthma  Past Surgical History: Reviewed history from 05/06/2010 and no changes required. Denies surgical history  Family History: Reviewed history from 05/06/2010 and no changes required. Mother, PE Father, Healthy  Social History: Reviewed history from 05/06/2010 and no changes required. 1 ppd, 19 yrs No ETOH Occasional Marijuana AC Physical Exam General appearance: well developed, well nourished, no acute distress Oral/Pharynx: tongue normal, posterior pharynx without erythema or exudate MSE: oriented to time, place, and person Scattered linear excoriation, slightly raised.  No signs of infection.  Areas include both legs, hands, arms, back, torso, and neck.   Assessment  New Problems: CONTACT DERMATITIS (ICD-692.9)   Plan New Medications/Changes: PREDNISONE 20 MG TABS (PREDNISONE) 1 by mouth two times a day for 4 days  #QS x 0, 05/25/2011, Hoyt Koch MD  New Orders: Est. Patient Level IV [phone] Admin of Therapeutic Inj  intramuscular or subcutaneous [96372] Solumedrol up to 125mg  [J2930] Planning Comments:   Knowing that his son was recently treated for scabies, it's still quite possible that this is scabies.  Patient used the cream and can use it again after a week. Wash sheets in hot water.  Will give Solumedrol shot and prednisone  for itching in case it's poison ivy.  If not improving, should go to dermatologist.  Can use benedryl by mouth as well.    The patient and/or caregiver has been counseled thoroughly with regard to medications prescribed including dosage, schedule, interactions, rationale for use, and possible side effects and they verbalize understanding.  Diagnoses and expected course of recovery discussed and will return if not improved as expected or if the condition worsens. Patient and/or caregiver verbalized understanding.  Prescriptions: PREDNISONE 20 MG TABS (PREDNISONE) 1 by mouth two times a day for 4 days  #QS x 0   Entered and Authorized by:   Hoyt Koch MD   Signed by:   Hoyt Koch MD on 05/25/2011   Method used:   Print then Give to Patient   RxID:   [id number]   Medication Administration  Injection # 1:    Medication: Solumedrol up to 125mg     Diagnosis: CONTACT DERMATITIS (ICD-692.9)    Route: IM    Site: RUOQ gluteus    Exp Date: 12/27/2013    Lot #: [id number]    Mfr: [employer]    Patient tolerated injection without complications    Given by: Lajean Saver RN (May 25, 2011 10:02 AM)  Orders Added: 1)  Est. Patient Level IV [95621] 2)  Admin of Therapeutic Inj  intramuscular or subcutaneous [96372] 3)  Solumedrol up to 125mg  [J2930]

## 2011-11-29 NOTE — Letter (Signed)
Summary: Out of Work  [facility]  [address]   [place], [place] [address]   Phone: [phone]  Fax: [phone]    August 02, 2011   Employee:  BERNERD TERHUNE    To Whom It May Concern:   For Medical reasons, please excuse the above named employee from work for the following dates:  Start:   08/02/11  End:   08/04/11  If you need additional information, please feel free to contact our office.         Sincerely,    Clemens Catholic LPN

## 2011-11-29 NOTE — Letter (Signed)
Summary: Out of Work  [facility]  [address]   [place], [place] [address]   Phone: [phone]  Fax: [phone]    October 11, 2011   Employee:  TURHAN CHILL    To Whom It May Concern:   For Medical reasons, please excuse the above named employee from work for the following dates:  Start:   10/11/11  End:   10/14/11  If you need additional information, please feel free to contact our office.         Sincerely,    Clemens Catholic LPN

## 2011-11-29 NOTE — Progress Notes (Signed)
Summary: ITCHING ON LEGS (rm 5)   Vital Signs:  Patient Profile:   35 Years Old Male CC:      itching-both legs x 3 days Height:     72 inches Weight:      179 pounds O2 Sat:      97 % O2 treatment:    Room Air Temp:     97.6 degrees F oral Pulse rate:   86 / minute Resp:     16 per minute BP sitting:   118 / 80  (left arm) Cuff size:   regular  Vitals Entered By: Lajean Saver RN (May 20, 2011 10:04 AM)                  Updated Prior Medication List: ALBUTEROL SULFATE (2.5 MG/3ML) 0.083% NEBU (ALBUTEROL SULFATE) prn  Current Allergies: No known allergies History of Present Illness Chief Complaint: itching-both legs x 3 days History of Present Illness:  Subjective:  Patient complains of itching rash on both lower legs for 3 days.  His son has been recently treated for scabies.  He feels well otherwise.  REVIEW OF SYSTEMS Constitutional Symptoms      Denies fever, chills, night sweats, weight loss, weight gain, and fatigue.  Eyes       Denies change in vision, eye pain, eye discharge, glasses, contact lenses, and eye surgery. Ear/Nose/Throat/Mouth       Denies hearing loss/aids, change in hearing, ear pain, ear discharge, dizziness, frequent runny nose, frequent nose bleeds, sinus problems, sore throat, hoarseness, and tooth pain or bleeding.  Respiratory       Complains of asthma.      Denies dry cough, productive cough, wheezing, shortness of breath, bronchitis, and emphysema/COPD.      Comments: C/o worsening asthma Cardiovascular       Denies murmurs, chest pain, and tires easily with exhertion.    Gastrointestinal       Denies stomach pain, nausea/vomiting, diarrhea, constipation, blood in bowel movements, and indigestion. Genitourniary       Denies painful urination, kidney stones, and loss of urinary control. Neurological       Denies paralysis, seizures, and fainting/blackouts. Musculoskeletal       Complains of redness.      Denies muscle pain, joint  pain, joint stiffness, decreased range of motion, swelling, muscle weakness, and gout.  Skin       Denies bruising, unusual mles/lumps or sores, and hair/skin or nail changes.      Comments: itching Psych       Denies mood changes, temper/anger issues, anxiety/stress, speech problems, depression, and sleep problems. Other Comments: Bilateral leg itching/burning. Son treated for scabies unsuccessfully few times. House cleaned.    Past History:  Past Medical History:  Asthma  Past Surgical History: Reviewed history from 05/06/2010 and no changes required. Denies surgical history  Family History: Reviewed history from 05/06/2010 and no changes required. Mother, PE Father, Healthy  Social History: Reviewed history from 05/06/2010 and no changes required. 1 ppd, 19 yrs No ETOH Occasional Marijuana AC   Objective:  Appearance:  Patient appears healthy, stated age, and in no acute distress  Skin, legs below knees:  several scattered erythematous linear lesions with satellite lesions.  No pustules, tenderness or swelling. Assessment New Problems: SCABIES (ICD-133.0) ASTHMA (ICD-493.90)   Plan New Medications/Changes: PERMETHRIN 5 % CREA (PERMETHRIN) Massage into skin from head to toe, leave on 8-14 hours, then wash off.  May repeat in 14 days.  #  60gm x 1, 05/20/2011, Donna Christen MD  New Orders: Est. Patient Level III [phone] Planning Comments:   Apply permethrin cream at bedtime; wash off next morning.  May repeat in two weeks.  Wash all bedclothes and clothes.  May take Benadryl 25mg , 2 caps at bedtime for itching.   The patient and/or caregiver has been counseled thoroughly with regard to medications prescribed including dosage, schedule, interactions, rationale for use, and possible side effects and they verbalize understanding.  Diagnoses and expected course of recovery discussed and will return if not improved as expected or if the condition worsens. Patient and/or  caregiver verbalized understanding.  Prescriptions: PERMETHRIN 5 % CREA (PERMETHRIN) Massage into skin from head to toe, leave on 8-14 hours, then wash off.  May repeat in 14 days.  #60gm x 1   Entered and Authorized by:   Donna Christen MD   Signed by:   Donna Christen MD on 05/20/2011   Method used:   Print then Give to Patient   RxID:   [id number]   Orders Added: 1)  Est. Patient Level III [95621]

## 2011-11-29 NOTE — Letter (Signed)
Summary: Out of Work  [facility]  [address]   [place], [place] [address]   Phone: [phone]  Fax: [phone]    June 08, 2011   Employee:  AHMAAD NEIDHARDT    To Whom It May Concern:   For Medical reasons, please excuse the above named employee from work for the following dates:  Start:   06/08/2011 Return: 06/10/2011  If you need additional information, please feel free to contact our office.         Sincerely,    Clemens Catholic LPN

## 2011-11-29 NOTE — Telephone Encounter (Signed)
  Phone Note Other Incoming   Summary of Call: pt came in to be seen tonight with nausea, large amts of blood in stool, no appetite, weak, shaking and abd cramping since his last visit. per dr Orson Aloe advised the pt to go to the ER for evaluation and possible imaging. work note given at pts request and he agrees to go to [facility] med center ED for evaluation. Initial call taken by: Clemens Catholic LPN,  August 02, 2011 5:52 PM

## 2011-11-29 NOTE — Progress Notes (Signed)
Summary: sob (rm 4)   Vital Signs:  Patient Profile:   35 Years Old Male CC:      productive cough, congestion, fever, SOB x 1 week Height:     72 inches Weight:      178 pounds O2 Sat:      98 % O2 treatment:    Room Air Temp:     97.7 degrees F oral Pulse rate:   85 / minute Resp:     18 per minute BP sitting:   106 / 71  (left arm) Cuff size:   regular  Vitals Entered By: Lajean Saver RN (October 11, 2011 10:53 AM)                  Updated Prior Medication List: ALBUTEROL SULFATE (2.5 MG/3ML) 0.083% NEBU (ALBUTEROL SULFATE) prn ABILIFY 10 MG TABS (ARIPIPRAZOLE)  LEXAPRO 10 MG TABS (ESCITALOPRAM OXALATE)   Current Allergies: No known allergies History of Present Illness Chief Complaint: productive cough, congestion, fever, SOB x 1 week History of Present Illness: 35 Years Old Male complains of onset of cold symptoms for 1 week.  Lott has been using Zpak and albuterol which is helping a little bit.  He went to ER last month, told her had walking PNA, given ABX and felt better.  He has been having recurrent monthly symptoms.   No sore throat + cough No pleuritic pain + wheezing + nasal congestion + post-nasal drainage + sinus pain/pressure + chest congestion No itchy/red eyes No earache No hemoptysis No SOB No chills/sweats No fever No nausea No vomiting No abdominal pain No diarrhea No skin rashes No fatigue No myalgias No headache   REVIEW OF SYSTEMS Constitutional Symptoms       Complains of fever, night sweats, and fatigue.     Denies chills, weight loss, and weight gain.      Comments: body aches Eyes       Denies change in vision, eye pain, eye discharge, glasses, contact lenses, and eye surgery. Ear/Nose/Throat/Mouth       Complains of frequent runny nose and sinus problems.      Denies hearing loss/aids, change in hearing, ear pain, ear discharge, dizziness, frequent nose bleeds, sore throat, hoarseness, and tooth pain or bleeding.    Respiratory       Complains of productive cough, wheezing, shortness of breath, and asthma.      Denies dry cough, bronchitis, and emphysema/COPD.  Cardiovascular       Denies murmurs, chest pain, and tires easily with exhertion.    Gastrointestinal       Denies stomach pain, nausea/vomiting, diarrhea, constipation, blood in bowel movements, and indigestion. Genitourniary       Denies painful urination, blood or discharge from penis, kidney stones, and loss of urinary control. Neurological       Denies paralysis, seizures, and fainting/blackouts. Musculoskeletal       Denies muscle pain, joint pain, joint stiffness, decreased range of motion, redness, swelling, muscle weakness, and gout.  Skin       Denies bruising, unusual mles/lumps or sores, and hair/skin or nail changes.  Psych       Denies mood changes, temper/anger issues, anxiety/stress, speech problems, depression, and sleep problems. Other Comments: Patient c/o symptoms x 1 week. He worked without rest all of last week. He c/o bodyaches, cough, fever. He has taken mucinex OTC.   Past History:  Past Medical History: Bipolar Asthma  Past Surgical History: Reviewed history from 05/06/2010  and no changes required. Denies surgical history  Family History: Reviewed history from 05/06/2010 and no changes required. Mother, PE Father, Healthy  Social History: Reviewed history from 05/06/2010 and no changes required. 1 ppd, 19 yrs No ETOH Occasional Marijuana AC Physical Exam General appearance: well developed, well nourished, no acute distress other than cough Ears: normal, no lesions or deformities Nasal: clear discharge Oral/Pharynx: clear PND, no erythema, no exudate Chest/Lungs: scattered wheezes throughout all lobes Heart: regular rate and  rhythm, no murmur MSE: oriented to time, place, and person Assessment New Problems: BRONCHITIS, ACUTE WITH BRONCHOSPASM (ICD-466.0)   Patient Education: Patient and/or  caregiver instructed in the following: quit smoking.  Plan New Medications/Changes: AVELOX 400 MG TABS (MOXIFLOXACIN HCL) 1 by mouth daily for 7 days  #7 x 0, 10/11/2011, Hoyt Koch MD PREDNISONE (PAK) 10 MG TABS (PREDNISONE) 6 day pack, use as directed  #1 x 0, 10/11/2011, Hoyt Koch MD  New Orders: Est. Patient Level IV [45409] Pulse Oximetry (single measurment) [94760] Planning Comments:   1)  Take the prescribed antibiotic as instructed.  Due to recurrent symptoms, I am sending him to ENT for evaluation.   2)  Use nasal saline solution (over the counter) at least 3 times a day. 3)  Use over the counter decongestants like Zyrtec-D every 12 hours as needed to help with congestion. 4)  Can take tylenol every 6 hours or motrin every 8 hours for pain or fever. 5)  Follow up with your primary doctor  if no improvement in 5-7 days, sooner if increasing pain, fever, or new symptoms.    The patient and/or caregiver has been counseled thoroughly with regard to medications prescribed including dosage, schedule, interactions, rationale for use, and possible side effects and they verbalize understanding.  Diagnoses and expected course of recovery discussed and will return if not improved as expected or if the condition worsens. Patient and/or caregiver verbalized understanding.  Prescriptions: AVELOX 400 MG TABS (MOXIFLOXACIN HCL) 1 by mouth daily for 7 days  #7 x 0   Entered and Authorized by:   Hoyt Koch MD   Signed by:   Hoyt Koch MD on 10/11/2011   Method used:   Print then Give to Patient   RxID:   [id number] PREDNISONE (PAK) 10 MG TABS (PREDNISONE) 6 day pack, use as directed  #1 x 0   Entered and Authorized by:   Hoyt Koch MD   Signed by:   Hoyt Koch MD on 10/11/2011   Method used:   Print then Give to Patient   RxID:   [id number]   Orders Added: 1)  Est. Patient Level IV [32440] 2)  Pulse Oximetry (single measurment) [10272]

## 2012-04-01 ENCOUNTER — Emergency Department
Admission: EM | Admit: 2012-04-01 | Discharge: 2012-04-01 | Disposition: A | Discharge status: Home / Self Care | Payer: Medicaid Other | Source: Home / Self Care

## 2012-04-01 DIAGNOSIS — L255 Unspecified contact dermatitis due to plants, except food: Secondary | ICD-10-CM

## 2012-04-01 DIAGNOSIS — R238 Other skin changes: Secondary | ICD-10-CM

## 2012-04-01 MED ORDER — METHYLPREDNISOLONE 4 MG PO KIT: PACK | ORAL | Status: AC

## 2012-04-01 NOTE — Discharge Instructions (Signed)
Poison Ivy Poison ivy is a rash caused by touching the leaves of the poison ivy plant. The rash often shows up 48 hours later. You might just have bumps, redness, and itching. Sometimes, blisters appear and break open. Your eyes may get puffy (swollen). Poison ivy often heals in 2 to 3 weeks without treatment. HOME CARE  If you touch poison ivy:   Wash your skin with soap and water right away. Wash under your fingernails. Do not rub the skin very hard.   Wash any clothes you were wearing.   Avoid poison ivy in the future. Poison ivy has 3 leaves on a stem.   Use medicine to help with itching as told by your doctor. Do not drive when you take this medicine.   Keep open sores dry, clean, and covered with a bandage and medicated cream, if needed.   Ask your doctor about medicine for children.  GET HELP RIGHT AWAY IF:  You have open sores.   Redness spreads beyond the area of the rash.   There is yellowish white fluid (pus) coming from the rash.   Pain gets worse.   You have a temperature by mouth above 102 F (38.9 C), not controlled by medicine.  MAKE SURE YOU:  Understand these instructions.   Will watch your condition.   Will get help right away if you are not doing well or get worse.  Document Released: 01/15/2011 Document Revised: 12/02/2011 Document Reviewed: 01/15/2011 [employer] Patient Information 2012 [employer]. 

## 2012-04-01 NOTE — ED Notes (Signed)
States he gets poison ivy often but this one looks differently. Noted yesterday am

## 2012-04-01 NOTE — ED Provider Notes (Signed)
Agree with exam, assessment, and plan.   Lattie Haw, MD 04/01/12 [phone]

## 2012-04-01 NOTE — ED Provider Notes (Signed)
History     CSN: [id number]  Arrival date & time 04/01/12  0945   None     Chief Complaint  Patient presents with  . Poison Ivy   (Consider location/radiation/quality/duration/timing/severity/associated sxs/prior treatment) HPI This patient complains of a RASH, with history of same that usually requires steroid injection.  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 No pertinent past medical history.  History reviewed. No pertinent past surgical history.  History reviewed. No pertinent family history.  History  Substance Use Topics  . Smoking status: Current Everyday Smoker  . Smokeless tobacco: Not on file  . Alcohol Use: No      Review of Systems  All other systems reviewed and are negative.    Allergies  Review of patient's allergies indicates no known allergies.  Home Medications   Current Outpatient Rx  Name Route Sig Dispense Refill  . METHYLPREDNISOLONE 4 MG PO KIT  Take as directed 21 tablet 0    BP 116/78  Pulse 94  Temp(Src) 97.9 F (36.6 C) (Oral)  Resp 18  Ht 6\' 2"  (1.88 m)  Wt 200 lb 4 oz (90.833 kg)  BMI 25.71 kg/m2  SpO2 98%  Physical Exam  Constitutional: He is oriented to person, place, and time. Vital signs are normal. He appears well-developed and well-nourished. He is active and cooperative.  HENT:  Head: Normocephalic.  Eyes: Conjunctivae are normal. Pupils are equal, round, and reactive to light. No scleral  icterus.  Neck: Trachea normal. Neck supple.  Cardiovascular: Normal rate and regular rhythm.   Pulmonary/Chest: Effort normal and breath sounds normal.  Neurological: He is alert and oriented to person, place, and time. No cranial nerve deficit or sensory deficit.  Skin: Skin is warm and dry. Lesion noted.     Psychiatric: He has a normal mood and affect. His speech is normal and behavior is normal. Judgment and thought content normal. Cognition and memory are normal.    ED Course  Procedures (including critical care time)  Labs Reviewed - No data to display No results found.   1. Rash, vesicular   2. Rhus dermatitis       MDM  Clean area well with soap and water.  Take antihistamine of your choice, take Medrol dose pack as directed.  Avoid contact with known allergens as discussed.  RTC if symptoms worsen.        Johnsie Kindred, NP 04/01/12 1129  Johnsie Kindred, NP 04/01/12 1130

## 2012-07-22 ENCOUNTER — Encounter: Payer: Self-pay | Admitting: *Deleted

## 2012-07-22 ENCOUNTER — Emergency Department
Admission: EM | Admit: 2012-07-22 | Discharge: 2012-07-22 | Disposition: A | Discharge status: Home / Self Care | Payer: Medicaid Other | Source: Home / Self Care

## 2012-07-22 DIAGNOSIS — L237 Allergic contact dermatitis due to plants, except food: Secondary | ICD-10-CM

## 2012-07-22 DIAGNOSIS — L255 Unspecified contact dermatitis due to plants, except food: Secondary | ICD-10-CM

## 2012-07-22 MED ORDER — METHYLPREDNISOLONE SODIUM SUCC 125 MG IJ SOLR
25.0000 mg | Freq: Once | INTRAMUSCULAR | Status: AC
  Administered 2012-07-22: 25 mg via INTRAMUSCULAR

## 2012-07-22 MED ORDER — PREDNISONE 50 MG PO TABS: ORAL_TABLET | ORAL | Status: AC

## 2012-07-22 MED ORDER — DIPHENHYDRAMINE HCL 50 MG/ML IJ SOLN
25.0000 mg | Freq: Once | INTRAMUSCULAR | Status: AC
  Administered 2012-07-22: 25 mg via INTRAMUSCULAR

## 2012-07-22 NOTE — ED Provider Notes (Signed)
History     CSN: [id number]  Arrival date & time 07/22/12  1221   First MD Initiated Contact with Patient 07/22/12 1242      Chief Complaint  Patient presents with  . Poison Ivy   HPI Comments: HPI  This patient complains of a RASH  Location: arms, abdomen, face  Onset: 2-3 days   Course: gradually worsening rash and itching   Self-treated with: topical cream   Improvement with treatment: no improvement   History  Itching: yes  Tenderness: no  New medications/antibiotics: no  Pet exposure: no  Recent travel or tropical exposure: no  New soaps, shampoos, detergent, clothing: no  Tick/insect exposure: no  Red Flags  Feeling ill: no  Fever: no  Facial/tongue swelling/difficulty breathing: no  Diabetic or immunocompromised: no      Patient is a 36 y.o. male presenting with Poison Ivy.  Poison Lajoyce Corners This is a new problem. Episode onset: 3 days ago  The problem occurs constantly. The problem has been gradually worsening.  Pt previously exposed to poison ivy in the past. Is usually very allergic.  Tried to spray area in yard where poison ivy was.  Went to clear area after spraying.  Noticed rash next day.  Has progressively worsened since this point.   History reviewed. No pertinent past medical history.  History reviewed. No pertinent past surgical history.  History reviewed. No pertinent family history.  History  Substance Use Topics  . Smoking status: Current Everyday Smoker -- 1.0 packs/day  . Smokeless tobacco: Not on file  . Alcohol Use: No      Review of Systems  All other systems reviewed and are negative.    Allergies  Review of patient's allergies indicates no known allergies.  Home Medications   Current Outpatient Rx  Name Route Sig Dispense Refill  . ALPRAZOLAM 0.5 MG PO TABS Oral Take 0.5 mg by mouth at bedtime as needed.    . ARIPIPRAZOLE 10 MG PO TABS Oral Take 10 mg by mouth daily.    Marland Kitchen ESCITALOPRAM OXALATE 10 MG PO TABS Oral Take 10 mg  by mouth daily.      BP 130/78  Pulse 69  Temp 97.7 F (36.5 C) (Oral)  Resp 18  Ht 6\' 2"  (1.88 m)  Wt 185 lb 8 oz (84.142 kg)  BMI 23.82 kg/m2  SpO2 97%  Physical Exam  Constitutional: He appears well-developed and well-nourished.  HENT:  Head: Normocephalic and atraumatic.  Eyes: Conjunctivae are normal. Pupils are equal, round, and reactive to light.  Neck: Normal range of motion. Neck supple.  Skin:       ED Course  Procedures (including critical care time)  Labs Reviewed - No data to display No results found.   No diagnosis found.    MDM  Will treat with solumedrol 125 IM x 1 (pt requests this).  Benadryl 25mg  IM x 1.  Prednisone x 4 days.  Benadryl for itching.  Discussed infectious red flags.  Handout given.  Follow up as needed.     The patient and/or caregiver has been counseled thoroughly with regard to treatment plan and/or medications prescribed including dosage, schedule, interactions, rationale for use, and possible side effects and they verbalize understanding. Diagnoses and expected course of recovery discussed and will return if not improved as expected or if the condition worsens. Patient and/or caregiver verbalized understanding.             Floydene Flock, MD 07/22/12 1248

## 2012-07-22 NOTE — ED Notes (Signed)
Pt was in bushes doing work an got into poison ivy.  Started 3 days ago and continues to spread an arms stomach forehead

## 2012-07-27 NOTE — ED Provider Notes (Signed)
Agree with exam, assessment, and plan.   Lattie Haw, MD 07/27/12 [phone]

## 2013-11-15 ENCOUNTER — Encounter: Payer: Self-pay | Admitting: Emergency Medicine

## 2013-11-15 ENCOUNTER — Emergency Department (INDEPENDENT_AMBULATORY_CARE_PROVIDER_SITE_OTHER)
Admission: EM | Admit: 2013-11-15 | Discharge: 2013-11-15 | Disposition: A | Discharge status: Home / Self Care | Payer: Medicaid Other | Source: Home / Self Care

## 2013-11-15 DIAGNOSIS — Z2089 Contact with and (suspected) exposure to other communicable diseases: Secondary | ICD-10-CM

## 2013-11-15 DIAGNOSIS — Z23 Encounter for immunization: Secondary | ICD-10-CM

## 2013-11-15 DIAGNOSIS — Z202 Contact with and (suspected) exposure to infections with a predominantly sexual mode of transmission: Secondary | ICD-10-CM

## 2013-11-15 DIAGNOSIS — Z0000 Encounter for general adult medical examination without abnormal findings: Secondary | ICD-10-CM

## 2013-11-15 LAB — HIV ANTIBODY (ROUTINE TESTING W REFLEX): HIV: NONREACTIVE

## 2013-11-15 MED ORDER — AZITHROMYCIN 250 MG PO TABS: ORAL_TABLET | ORAL | Status: DC

## 2013-11-15 MED ORDER — INFLUENZA VAC SPLIT QUAD 0.5 ML IM SUSP
0.5000 mL | Freq: Once | INTRAMUSCULAR | Status: AC
  Administered 2013-11-15: 0.5 mL via INTRAMUSCULAR

## 2013-11-15 MED ORDER — METRONIDAZOLE 500 MG PO TABS: 500.0000 mg | ORAL_TABLET | Freq: Two times a day (BID) | ORAL | Status: DC

## 2013-11-15 MED ORDER — CEFTRIAXONE SODIUM 250 MG IJ SOLR
250.0000 mg | Freq: Once | INTRAMUSCULAR | Status: AC
  Administered 2013-11-15: 250 mg via INTRAMUSCULAR

## 2013-11-15 NOTE — ED Provider Notes (Signed)
CSN: [id number]     Arrival date & time 11/15/13  1032 History   None    Chief Complaint  Patient presents with  . Exposure to STD    HPI  Pt presents today with STD exposure  Pt states that himself, his wife and another male engaged in consentual sexual active Pt's wife started having vaginal discharge this week. Was dxd with trichamonas.  Pt has been asymptomatic but would like to be evaluated for STDs No hx/o STDs in the past.  No penile discharge No dysuria, fever, back pain.   History reviewed. No pertinent past medical history. History reviewed. No pertinent past surgical history. History reviewed. No pertinent family history. History  Substance Use Topics  . Smoking status: Current Every Day Smoker -- 1.00 packs/day  . Smokeless tobacco: Never Used  . Alcohol Use: No    Review of Systems  All other systems reviewed and are negative.    Allergies  Review of patient's allergies indicates no known allergies.  Home Medications   Current Outpatient Rx  Name  Route  Sig  Dispense  Refill  . ALPRAZolam (XANAX) 0.5 MG tablet   Oral   Take 0.5 mg by mouth at bedtime as needed.         . ARIPiprazole (ABILIFY) 10 MG tablet   Oral   Take 10 mg by mouth daily.         Marland Kitchen atomoxetine (STRATTERA) 10 MG capsule   Oral   Take 10 mg by mouth daily.          BP 146/82  Pulse 65  Temp(Src) 97.9 F (36.6 C) (Oral)  Resp 16  Wt 198 lb (89.812 kg)  SpO2 100% Physical Exam  Constitutional: He appears well-developed and well-nourished.  HENT:  Head: Normocephalic and atraumatic.  Eyes: Conjunctivae are normal. Pupils are equal, round, and reactive to light.  Neck: Normal range of motion. Neck supple.  Cardiovascular: Normal rate and regular rhythm.   Pulmonary/Chest: Effort normal and breath sounds normal.  Abdominal: Soft.  Genitourinary: Penis normal.  Musculoskeletal: Normal range of motion.  Neurological: He is alert.  Skin: Skin is warm.    ED  Course  Procedures (including critical care time) Labs Review Labs Reviewed  GC/CHLAMYDIA PROBE AMP, URINE  HIV ANTIBODY (ROUTINE TESTING)  RPR   Imaging Review No results found.  EKG Interpretation    Date/Time:    Ventricular Rate:    PR Interval:    QRS Duration:   QT Interval:    QTC Calculation:   R Axis:     Text Interpretation:              MDM   1. Exposure to STD   2. Preventative health care    Rocephin 250mg  IM x1 Azithro 1gm po x1, flagyl 500 BID x 7 days Will check urine GC, Chl, HIV and RPR Discussed safe sex practice at length Follow up as needed.     The patient and/or caregiver has been counseled thoroughly with regard to treatment plan and/or medications prescribed including dosage, schedule, interactions, rationale for use, and possible side effects and they verbalize understanding. Diagnoses and expected course of recovery discussed and will return if not improved as expected or if the condition worsens. Patient and/or caregiver verbalized understanding.         Doree Albee, MD 11/15/13 [phone]

## 2013-11-15 NOTE — ED Notes (Signed)
Kelsen's reports his wife tested positive for trichomonas and he needs treatment. He would also like to be tested for other STDs. He is currently asymptomatic.

## 2013-11-16 LAB — GC/CHLAMYDIA PROBE AMP, URINE
Chlamydia, Swab/Urine, PCR: NEGATIVE
GC Probe Amp, Urine: NEGATIVE

## 2013-11-17 ENCOUNTER — Telehealth: Payer: Self-pay | Admitting: Emergency Medicine

## 2014-01-05 ENCOUNTER — Encounter: Payer: Self-pay | Admitting: Emergency Medicine

## 2014-01-05 ENCOUNTER — Emergency Department (INDEPENDENT_AMBULATORY_CARE_PROVIDER_SITE_OTHER)
Admission: EM | Admit: 2014-01-05 | Discharge: 2014-01-05 | Disposition: A | Discharge status: Home / Self Care | Payer: Medicaid Other | Source: Home / Self Care | Attending: Family Medicine | Admitting: Family Medicine

## 2014-01-05 DIAGNOSIS — J329 Chronic sinusitis, unspecified: Secondary | ICD-10-CM

## 2014-01-05 MED ORDER — METHYLPREDNISOLONE ACETATE 80 MG/ML IJ SUSP
80.0000 mg | Freq: Once | INTRAMUSCULAR | Status: AC
  Administered 2014-01-05: 80 mg via INTRAMUSCULAR

## 2014-01-05 MED ORDER — AMOXICILLIN-POT CLAVULANATE 875-125 MG PO TABS: 1.0000 | ORAL_TABLET | Freq: Two times a day (BID) | ORAL | Status: DC

## 2014-01-05 NOTE — ED Notes (Signed)
Patient c/o congestion, dry cough, and sinus pressure since yesterday afternoon. Patient states he has not tried anything OTC.

## 2014-01-05 NOTE — ED Provider Notes (Signed)
CSN: [id number]     Arrival date & time 01/05/14  1116 History   First MD Initiated Contact with Patient 01/05/14 1119     Chief Complaint  Patient presents with  . Sinusitis    HPI  SINUSITIS Onset:  2-3 days  Location: bilateral maxillary sinuses  Description:bilateral maxillary (L>R) sinus TTP   Modifying factors: 1 ppd smoker, treated with PCN for dental abscess 4 weeks ago.   Symptoms Cough:  no Discharge:  yes Fever: no Sinus Pressure:  yes Ears Blocked:  no Teeth Ache:  yes Frontal Headache:  mild Second Sickening:  no  Red Flags Change in mental state: no Change in vision: no    History reviewed. No pertinent past medical history. History reviewed. No pertinent past surgical history. History reviewed. No pertinent family history. History  Substance Use Topics  . Smoking status: Current Every Day Smoker -- 1.00 packs/day  . Smokeless tobacco: Never Used  . Alcohol Use: No    Review of Systems  All other systems reviewed and are negative.    Allergies  Review of patient's allergies indicates no known allergies.  Home Medications   Current Outpatient Rx  Name  Route  Sig  Dispense  Refill  . ALPRAZolam (XANAX) 0.5 MG tablet   Oral   Take 0.5 mg by mouth at bedtime as needed.         Marland Kitchen. amoxicillin-clavulanate (AUGMENTIN) 875-125 MG per tablet   Oral   Take 1 tablet by mouth 2 (two) times daily.   20 tablet   0   . ARIPiprazole (ABILIFY) 10 MG tablet   Oral   Take 10 mg by mouth daily.         Marland Kitchen. atomoxetine (STRATTERA) 10 MG capsule   Oral   Take 10 mg by mouth daily.         Marland Kitchen. azithromycin (ZITHROMAX) 250 MG tablet      4 tabs po x 1   4 tablet   0   . metroNIDAZOLE (FLAGYL) 500 MG tablet   Oral   Take 1 tablet (500 mg total) by mouth 2 (two) times daily.   14 tablet   0    BP 126/74  Pulse 76  Temp(Src) 97.6 F (36.4 C) (Oral)  Resp 16  Ht 6\' 2"  (1.88 m)  Wt 182 lb 8 oz (82.781 kg)  BMI 23.42 kg/m2  SpO2  96% Physical Exam  Constitutional: He is oriented to person, place, and time. He appears well-developed and well-nourished.  HENT:  Head: Normocephalic and atraumatic.  Right Ear: External ear normal.  Left Ear: External ear normal.  +nasal erythema, rhinorrhea bilaterally, + post oropharyngeal erythema  + maxillary sinus TTP   Eyes: Conjunctivae are normal. Pupils are equal, round, and reactive to light.  Neck: Normal range of motion. Neck supple.  Cardiovascular: Regular rhythm.   Pulmonary/Chest: Effort normal and breath sounds normal.  Abdominal: Soft. Bowel sounds are normal.  Musculoskeletal: Normal range of motion.  Neurological: He is alert and oriented to person, place, and time.  Skin: Skin is warm.    ED Course  Procedures (including critical care time) Labs Review Labs Reviewed - No data to display Imaging Review No results found.  EKG Interpretation    Date/Time:    Ventricular Rate:    PR Interval:    QRS Duration:   QT Interval:    QTC Calculation:   R Axis:     Text Interpretation:  MDM   1. Sinusitis    Will treat with augmentin. Consider escalation to levaquin if sxs not improved in 24-48 hours. Depomedrol 80mg  IM x1 Discussed infectious and ENT red flags at length. Follow up as needed.     The patient and/or caregiver has been counseled thoroughly with regard to treatment plan and/or medications prescribed including dosage, schedule, interactions, rationale for use, and possible side effects and they verbalize understanding. Diagnoses and expected course of recovery discussed and will return if not improved as expected or if the condition worsens. Patient and/or caregiver verbalized understanding.         Doree Albee, MD 01/05/14 1155

## 2014-01-23 ENCOUNTER — Emergency Department
Admission: EM | Admit: 2014-01-23 | Discharge: 2014-01-23 | Disposition: A | Discharge status: Home / Self Care | Payer: Medicaid Other | Source: Home / Self Care | Attending: Family Medicine | Admitting: Family Medicine

## 2014-01-23 ENCOUNTER — Encounter: Payer: Self-pay | Admitting: Emergency Medicine

## 2014-01-23 DIAGNOSIS — R197 Diarrhea, unspecified: Secondary | ICD-10-CM

## 2014-01-23 DIAGNOSIS — R110 Nausea: Secondary | ICD-10-CM

## 2014-01-23 MED ORDER — ONDANSETRON 4 MG PO TBDP: ORAL_TABLET | ORAL | Status: DC

## 2014-01-23 NOTE — ED Provider Notes (Signed)
CSN: [id number]     Arrival date & time 01/23/14  1108 History   First MD Initiated Contact with Patient 01/23/14 1146     Chief Complaint  Patient presents with  . Diarrhea  . Emesis     HPI Comments: Yesterday at about 2PM patient developed nausea/vomiting.  At about 1AM, he awoke with watery diarrhea that has persisted.  He has abdominal cramps that improve after a loose bowel movements. He has had chills but no fever.  No hematochezia.  Denies recent foreign travel, or drinking untreated water in a wilderness environment.   Patient is a 38 y.o. male presenting with diarrhea. The history is provided by the patient.  Diarrhea Quality:  Watery Severity:  Moderate Onset quality:  Sudden Number of episodes:  Numerous Duration:  10 hours Timing:  Intermittent Progression:  Improving Relieved by:  Nothing Worsened by:  Nothing tried Ineffective treatments:  None tried Associated symptoms: abdominal pain, chills and vomiting   Associated symptoms: no arthralgias, no recent cough, no diaphoresis, no fever, no myalgias and no URI   Risk factors: no recent antibiotic use, no sick contacts, no suspicious food intake and no travel to endemic areas     History reviewed. No pertinent past medical history. History reviewed. No pertinent past surgical history. History reviewed. No pertinent family history. History  Substance Use Topics  . Smoking status: Current Every Day Smoker -- 1.00 packs/day  . Smokeless tobacco: Never Used  . Alcohol Use: No    Review of Systems  Constitutional: Positive for chills. Negative for fever and diaphoresis.  Gastrointestinal: Positive for vomiting, abdominal pain and diarrhea.  Musculoskeletal: Negative for arthralgias and myalgias.  All other systems reviewed and are negative.    Allergies  Review of patient's allergies indicates no known allergies.  Home Medications   Current Outpatient Rx  Name  Route  Sig  Dispense  Refill  . ALPRAZolam  (XANAX) 0.5 MG tablet   Oral   Take 0.5 mg by mouth at bedtime as needed.         Marland Kitchen. amoxicillin-clavulanate (AUGMENTIN) 875-125 MG per tablet   Oral   Take 1 tablet by mouth 2 (two) times daily.   20 tablet   0   . ARIPiprazole (ABILIFY) 10 MG tablet   Oral   Take 10 mg by mouth daily.         Marland Kitchen. atomoxetine (STRATTERA) 10 MG capsule   Oral   Take 10 mg by mouth daily.         . ondansetron (ZOFRAN ODT) 4 MG disintegrating tablet      Take one tab by mouth Q6hr prn nausea   12 tablet   0    BP 119/76  Pulse 72  Temp(Src) 97.5 F (36.4 C) (Oral)  Resp 18  Ht 6\' 2"  (1.88 m)  Wt 192 lb (87.091 kg)  BMI 24.64 kg/m2  SpO2 98% Physical Exam Nursing notes and Vital Signs reviewed. Appearance:  Patient appears healthy, stated age, and in no acute distress Eyes:  Pupils are equal, round, and reactive to light and accomodation.  Extraocular movement is intact.  Conjunctivae are not inflamed  Ears:  Canals normal.  Tympanic membranes normal.  Nose:   Normal turbinates.  No sinus tenderness.  Pharynx:  Normal Neck:  Supple.  No adenopathy Lungs:  Clear to auscultation.  Breath sounds are equal.  Heart:  Regular rate and rhythm without murmurs, rubs, or gallops.  Abdomen:  Nontender without  masses or hepatosplenomegaly.  Bowel sounds are present and increased.  No CVA or flank tenderness.  Extremities:  No edema.  No calf tenderness Skin:  No rash present.   ED Course  Procedures  none      MDM   1. Nausea alone; suspect viral gastroenteritis  2. Diarrhea    Rx for Zofran 4mg  Begin clear liquids (Pedialyte while having diarrhea) until improved, then advance to a BRAT diet.  Then gradually resume a regular diet when tolerated.  Avoid milk products until well.  To decrease diarrhea, mix one heaping tablespoon Citrucel (methylcellulose) in 8 oz water and drink one to three times daily.  When stools become more formed, may take Imodium (loperamide) once or twice daily  to decrease stool frequency.  Followup with Family Doctor if not improved in about 5 days. If symptoms become significantly worse during the night or over the weekend, proceed to the local emergency room.     Lattie Haw, MD 01/23/14 2019

## 2014-01-23 NOTE — ED Notes (Signed)
Pt c/o vomiting and diarrhea x 1 day. Denies fever.

## 2014-01-23 NOTE — Discharge Instructions (Signed)
Begin clear liquids (Pedialyte while having diarrhea) until improved, then advance to a BRAT diet.  Then gradually resume a regular diet when tolerated.  Avoid milk products until well.  To decrease diarrhea, mix one heaping tablespoon Citrucel (methylcellulose) in 8 oz water and drink one to three times daily.  When stools become more formed, may take Imodium (loperamide) once or twice daily to decrease stool frequency.  °If symptoms become significantly worse during the night or over the weekend, proceed to the local emergency room.  °

## 2014-07-19 ENCOUNTER — Emergency Department
Admission: EM | Admit: 2014-07-19 | Discharge: 2014-07-19 | Discharge status: Absent without leave | Payer: Medicaid Other | Source: Home / Self Care

## 2016-01-04 ENCOUNTER — Emergency Department (INDEPENDENT_AMBULATORY_CARE_PROVIDER_SITE_OTHER)
Admission: EM | Admit: 2016-01-04 | Discharge: 2016-01-04 | Disposition: A | Discharge status: Home / Self Care | Payer: Medicaid Other | Source: Home / Self Care | Attending: Family Medicine | Admitting: Family Medicine

## 2016-01-04 ENCOUNTER — Encounter: Payer: Self-pay | Admitting: Emergency Medicine

## 2016-01-04 DIAGNOSIS — L255 Unspecified contact dermatitis due to plants, except food: Secondary | ICD-10-CM | POA: Diagnosis not present

## 2016-01-04 MED ORDER — PREDNISONE 20 MG PO TABS: ORAL_TABLET | ORAL | Status: DC

## 2016-01-04 MED ORDER — METHYLPREDNISOLONE SODIUM SUCC 125 MG IJ SOLR
80.0000 mg | Freq: Once | INTRAMUSCULAR | Status: AC
  Administered 2016-01-04: 80 mg via INTRAMUSCULAR

## 2016-01-04 NOTE — Discharge Instructions (Signed)
May take Benadryl as needed for itching ° ° °Poison Ivy °Poison ivy is a rash caused by touching the leaves of the poison ivy plant. The rash often shows up 48 hours later. You might just have bumps, redness, and itching. Sometimes, blisters appear and break open. Your eyes may get puffy (swollen). Poison ivy often heals in 2 to 3 weeks without treatment. °HOME CARE °· If you touch poison ivy: °¨ Wash your skin with soap and water right away. Wash under your fingernails. Do not rub the skin very hard. °¨ Wash any clothes you were wearing. °· Avoid poison ivy in the future. Poison ivy has 3 leaves on a stem. °· Use medicine to help with itching as told by your doctor. Do not drive when you take this medicine. °· Keep open sores dry, clean, and covered with a bandage and medicated cream, if needed. °· Ask your doctor about medicine for children. °GET HELP RIGHT AWAY IF: °· You have open sores. °· Redness spreads beyond the area of the rash. °· There is yellowish white fluid (pus) coming from the rash. °· Pain gets worse. °· You have a temperature by mouth above 102° F (38.9° C), not controlled by medicine. °MAKE SURE YOU: °· Understand these instructions. °· Will watch your condition. °· Will get help right away if you are not doing well or get worse. °  °This information is not intended to replace advice given to you by your health care provider. Make sure you discuss any questions you have with your health care provider. °  °Document Released: 01/15/2011 Document Revised: 03/06/2012 Document Reviewed: 05/21/2015 °Elsevier Interactive Patient Education ©2016 [employer] ° °

## 2016-01-04 NOTE — ED Provider Notes (Signed)
CSN: [id number]     Arrival date & time 01/04/16  1314 History   First MD Initiated Contact with Patient 01/04/16 1340     Chief Complaint  Patient presents with  . Rash      HPI Comments: Patient reports that he was clearing woods 6 days ago and recalls that he came into contact with poison ivy vines without their leaves.  Yesterday he began developing a pruritic rash on his right arm, and now both arms, and some on his legs.  He feels well otherwise.  Patient is a 40 y.o. male presenting with poison ivy. The history is provided by the patient.  Poison Lajoyce Cornersvy This is a recurrent problem. The current episode started yesterday. The problem occurs constantly. The problem has been gradually worsening. Associated symptoms comments: pruritis. Nothing aggravates the symptoms. Nothing relieves the symptoms. He has tried nothing for the symptoms.    History reviewed. No pertinent past medical history. History reviewed. No pertinent past surgical history. No family history on file. Social History  Substance Use Topics  . Smoking status: Current Every Day Smoker -- 1.00 packs/day  . Smokeless tobacco: Never Used  . Alcohol Use: No    Review of Systems  All other systems reviewed and are negative.   Allergies  Review of patient's allergies indicates no known allergies.  Home Medications   Prior to Admission medications   Medication Sig Start Date End Date Taking? Authorizing Provider  predniSONE (DELTASONE) 20 MG tablet Take one tab PO twice daily for 4 days, then one daily for two days.  Take with food. 01/04/16   Lattie HawStephen A Beese, MD   Meds Ordered and Administered this Visit   Medications  methylPREDNISolone sodium succinate (SOLU-MEDROL) 125 mg/2 mL injection 80 mg (not administered)    BP 117/72 mmHg  Pulse 71  Temp(Src) 98 F (36.7 C) (Oral)  Ht 6\' 2"  (1.88 m)  Wt 170 lb 12 oz (77.452 kg)  BMI 21.91 kg/m2  SpO2 99% No data found.   Physical Exam  Constitutional: He is  oriented to person, place, and time. He appears well-developed and well-nourished. No distress.  HENT:  Head: Atraumatic.  Nose: Nose normal.  Mouth/Throat: Oropharynx is clear and moist.  Eyes: Conjunctivae are normal. Pupils are equal, round, and reactive to light.  Neck: Neck supple.  Pulmonary/Chest: Breath sounds normal.  Lymphadenopathy:    He has no cervical adenopathy.  Neurological: He is alert and oriented to person, place, and time.  Skin: Skin is warm and dry. Rash noted.  There are scattered maculopapular erythematous small lesions, some linear, on both forearms.  There is no swelling, weeping or tenderness to palpation.  Nursing note and vitals reviewed.   ED Course  Procedures  none    MDM   1. Rhus dermatitis    Solumedrol 80mg  IM.  Begin prednisone taper tomorrow. May take Benadryl for itching.    Lattie HawStephen A Beese, MD 01/06/16 2325

## 2016-01-04 NOTE — ED Notes (Signed)
Pt c/o possible poison ivy on arm x 1 day, severely itching and pt is allergic to poison ivy.

## 2016-01-13 ENCOUNTER — Emergency Department
Admission: EM | Admit: 2016-01-13 | Discharge: 2016-01-13 | Disposition: A | Discharge status: Home / Self Care | Payer: Medicaid Other | Source: Home / Self Care | Attending: Family Medicine | Admitting: Family Medicine

## 2016-01-13 ENCOUNTER — Encounter: Payer: Self-pay | Admitting: *Deleted

## 2016-01-13 DIAGNOSIS — L259 Unspecified contact dermatitis, unspecified cause: Secondary | ICD-10-CM

## 2016-01-13 MED ORDER — TRIAMCINOLONE ACETONIDE 0.1 % EX CREA: 1.0000 "application " | TOPICAL_CREAM | Freq: Two times a day (BID) | CUTANEOUS | Status: DC

## 2016-01-13 MED ORDER — PREDNISONE 10 MG PO TABS: ORAL_TABLET | ORAL | Status: DC

## 2016-01-13 MED ORDER — METHYLPREDNISOLONE SODIUM SUCC 40 MG IJ SOLR
80.0000 mg | Freq: Once | INTRAMUSCULAR | Status: AC
  Administered 2016-01-13: 80 mg via INTRAMUSCULAR

## 2016-01-13 NOTE — Discharge Instructions (Signed)
May take Benadryl at bedtime for itching. °

## 2016-01-13 NOTE — ED Notes (Signed)
Pt reports that he is feeling okay from his injection at d/c. Clemens Catholic, LPN

## 2016-01-13 NOTE — ED Notes (Signed)
Pt c/o itching rash all over x 2 days.

## 2016-01-13 NOTE — ED Provider Notes (Signed)
CSN: [id number]     Arrival date & time 01/13/16  1908 History   First MD Initiated Contact with Patient 01/13/16 1920     Chief Complaint  Patient presents with  . Rash     HPI Comments: Patient was treated for poison ivy rash 9 days ago.  He states that he improved initially, but the rash and itching gradually recurred.  He feels well otherwise.  The history is provided by the patient.    History reviewed. No pertinent past medical history. History reviewed. No pertinent past surgical history. History reviewed. No pertinent family history. Social History  Substance Use Topics  . Smoking status: Current Every Day Smoker -- 1.00 packs/day  . Smokeless tobacco: Never Used  . Alcohol Use: No    Review of Systems  Constitutional: Negative for fever, chills, diaphoresis and fatigue.  HENT: Negative.   Eyes: Negative.   Respiratory: Negative.   Cardiovascular: Negative.   Gastrointestinal: Negative.   Genitourinary: Negative.   Musculoskeletal: Negative.   Skin: Positive for rash.    Allergies  Review of patient's allergies indicates no known allergies.  Home Medications   Prior to Admission medications   Medication Sig Start Date End Date Taking? Authorizing Provider  predniSONE (DELTASONE) 10 MG tablet Take 6 tabs po daily for 2 days, then 5 daily for 2 days, then 4 daily for 2 days, then 3 daily for 2 days, then 2 daily for 2 days, then 1 daily for 2 days.  Take with food. 01/13/16   Lattie Haw, MD  triamcinolone cream (KENALOG) 0.1 % Apply 1 application topically 2 (two) times daily. 01/13/16   Lattie Haw, MD   Meds Ordered and Administered this Visit   Medications  methylPREDNISolone sodium succinate (SOLU-MEDROL) 40 mg/mL injection 80 mg (80 mg Intramuscular Given 01/13/16 1930)    BP 122/71 mmHg  Pulse 78  Temp(Src) 98.3 F (36.8 C) (Oral)  Resp 18  Ht  (1.88 m)  Wt 169 lb (76.658 kg)  BMI 21.69 kg/m2  SpO2 98% No data found.   Physical Exam   Constitutional: He is oriented to person, place, and time. He appears well-developed and well-nourished. No distress.  HENT:  Head: Normocephalic.  Mouth/Throat: Oropharynx is clear and moist.  Eyes: Conjunctivae are normal. Pupils are equal, round, and reactive to light.  Neck: Neck supple.  Lymphadenopathy:    He has no cervical adenopathy.  Neurological: He is alert and oriented to person, place, and time.  Skin: Rash noted.  Persistent dermatitis (see previous office visit note) on trunk and upper extremities.  Nursing note and vitals reviewed.   ED Course  Procedures none    MDM   1. Contact dermatitis; persistent    Solumedrol  IM.  Tomorrow begin prednisone taper. Rx for triamcinolone 0.1% cream bid. Followup with dermatologist if not resolved 2 weeks.    Lattie Haw, MD 01/13/16 [phone]

## 2016-01-14 ENCOUNTER — Telehealth: Payer: Self-pay | Admitting: *Deleted

## 2016-01-14 NOTE — ED Notes (Unsigned)
Call back: called to check pt's status. He reports that he believes that he may have passed out in the clinic bathroom last night at his visit 5 minutes after receiving an injection of Solumedrol. When the pt was in the clinic myself and Helene Shoe, Rad tech heard a loud noise that we believed came from the bathroom. Myself, Inetta Fermo and Dr Cathren Harsh knocked on the bathroom door when the pt didn't reply I used the key to unlock the door. As I opened the door the pt was coming out and reported that he was okay and didn't hear a noise. I observed the pt for another 10 minutes before releasing him into his wife's care. He said that he didn't remember at the time passing out, but later recall a momentary LOC. He denies any pain or injury from his fall. Advised him to call back if he has any questions or concerns. Also asked the pt to notify the nurse in the future of this happening before injections are given. Pt agrees. Clemens Catholic, LPN

## 2016-04-07 ENCOUNTER — Encounter: Payer: Self-pay | Admitting: *Deleted

## 2016-04-07 ENCOUNTER — Emergency Department
Admission: EM | Admit: 2016-04-07 | Discharge: 2016-04-07 | Disposition: A | Discharge status: Home / Self Care | Payer: Medicaid Other | Source: Home / Self Care | Attending: Emergency Medicine | Admitting: Emergency Medicine

## 2016-04-07 DIAGNOSIS — L237 Allergic contact dermatitis due to plants, except food: Secondary | ICD-10-CM

## 2016-04-07 MED ORDER — METHYLPREDNISOLONE SODIUM SUCC 125 MG IJ SOLR
80.0000 mg | Freq: Once | INTRAMUSCULAR | Status: AC
  Administered 2016-04-07: 80 mg via INTRAMUSCULAR

## 2016-04-07 MED ORDER — PREDNISONE 10 MG PO TABS: ORAL_TABLET | ORAL | Status: DC

## 2016-04-07 NOTE — ED Provider Notes (Signed)
CSN: [id number]     Arrival date & time 04/07/16  1844 History   First MD Initiated Contact with Patient 04/07/16 1906     Chief Complaint  Patient presents with  . Rash   (Consider location/radiation/quality/duration/timing/severity/associated sxs/prior Treatment) Patient is a 40 y.o. male presenting with rash. The history is provided by the patient. No language interpreter was used.  Rash Location:  Full body Quality: itchiness and redness   Severity:  Moderate Onset quality:  Gradual Duration:  1 day Timing:  Constant Progression:  Worsening Chronicity:  New Relieved by:  Nothing Worsened by:  Nothing tried Ineffective treatments:  None tried Associated symptoms: no abdominal pain and no nausea   Pt complains of poison ivy on multiple areas of his body.  History reviewed. No pertinent past medical history. History reviewed. No pertinent past surgical history. History reviewed. No pertinent family history. Social History  Substance Use Topics  . Smoking status: Current Every Day Smoker -- 1.00 packs/day  . Smokeless tobacco: Never Used  . Alcohol Use: No    Review of Systems  Gastrointestinal: Negative for nausea and abdominal pain.  Skin: Positive for rash.  All other systems reviewed and are negative.   Allergies  Review of patient's allergies indicates no known allergies.  Home Medications   Prior to Admission medications   Medication Sig Start Date End Date Taking? Authorizing Provider  predniSONE (DELTASONE) 10 MG tablet 6,5,4,3,2,1 04/07/16   Elson Areas, PA-C   Meds Ordered and Administered this Visit   Medications  methylPREDNISolone sodium succinate (SOLU-MEDROL) 125 mg/2 mL injection 80 mg (80 mg Intramuscular Given 04/07/16 1919)    BP 114/73 mmHg  Pulse 68  Temp(Src) 97.9 F (36.6 C) (Oral)  Resp 14  Ht  (1.88 m)  Wt 166 lb (75.297 kg)  BMI 21.30 kg/m2  SpO2 97% No data found.   Physical Exam  Constitutional: He is oriented to  person, place, and time. He appears well-developed and well-nourished.  HENT:  Head: Normocephalic and atraumatic.  Eyes: Conjunctivae and EOM are normal. Pupils are equal, round, and reactive to light.  Neck: Normal range of motion.  Cardiovascular: Normal rate.   Pulmonary/Chest: Effort normal.  Abdominal: Soft. He exhibits no distension.  Musculoskeletal: Normal range of motion.  Neurological: He is alert and oriented to person, place, and time.  Skin: Rash noted.  Red raised erythematous rash. Linear,  Psychiatric: He has a normal mood and affect.  Nursing note and vitals reviewed.   ED Course  Procedures (including critical care time)  Labs Review Labs Reviewed - No data to display  Imaging Review No results found.   Visual Acuity Review  Right Eye Distance:   Left Eye Distance:   Bilateral Distance:    Right Eye Near:   Left Eye Near:    Bilateral Near:         MDM  Pt request a shot.  Pt states he wants the shot he has had here before.  I reviewed records.  Pt received solumedrol.   Pt given solumedrol IM   1. Poison ivy    Meds ordered this encounter  Medications  . methylPREDNISolone sodium succinate (SOLU-MEDROL) 125 mg/2 mL injection 80 mg    Sig:   . predniSONE (DELTASONE) 10 MG tablet    Sig: 6,5,4,3,2,1    Dispense:  21 tablet    Refill:  0    Order Specific Question:  Supervising Provider    Answer:  Georgina Pillion,  DAVID [[id number]]      Elson AreasLeslie K Sofia, PA-C 04/07/16 1947

## 2016-04-07 NOTE — Discharge Instructions (Signed)

## 2016-04-07 NOTE — ED Notes (Signed)
Pt c/o rash on his LT arm, leg and ankle x today. Hx of poison ivy rash.

## 2016-06-26 ENCOUNTER — Emergency Department (INDEPENDENT_AMBULATORY_CARE_PROVIDER_SITE_OTHER)
Admission: EM | Admit: 2016-06-26 | Discharge: 2016-06-26 | Disposition: A | Discharge status: Home / Self Care | Payer: Medicaid Other | Source: Home / Self Care | Attending: Family Medicine | Admitting: Family Medicine

## 2016-06-26 ENCOUNTER — Encounter: Payer: Self-pay | Admitting: Emergency Medicine

## 2016-06-26 DIAGNOSIS — L255 Unspecified contact dermatitis due to plants, except food: Secondary | ICD-10-CM

## 2016-06-26 MED ORDER — CETIRIZINE HCL 10 MG PO TABS: 10.0000 mg | ORAL_TABLET | Freq: Every day | ORAL | Status: DC

## 2016-06-26 MED ORDER — METHYLPREDNISOLONE SODIUM SUCC 40 MG IJ SOLR
80.0000 mg | Freq: Once | INTRAMUSCULAR | Status: AC
  Administered 2016-06-26: 80 mg via INTRAMUSCULAR

## 2016-06-26 MED ORDER — PREDNISONE 20 MG PO TABS: ORAL_TABLET | ORAL | Status: DC

## 2016-06-26 NOTE — ED Provider Notes (Signed)
CSN: [id number]     Arrival date & time 06/26/16  1422 History   First MD Initiated Contact with Patient 06/26/16 1424     Chief Complaint  Patient presents with  . Rash   (Consider location/radiation/quality/duration/timing/severity/associated sxs/prior Treatment) HPI  [name] is a 40 y.o. male presenting to UC with c/o scattered erythematous mildly pruritic rash c/w prior rashes he's had from poison ivy, 2 days after working outside. He reports working in the yard often and gets a bad rash from poison ivy about 7 times a year.  He notes he only gets better with a solumedrol shot and prednisone pills. Triamcinolone cream does not help. He has not taken any antihistamines as he does not know what those are.  Denies oral swelling, fever, n/v/d. No new soaps lotions or medications.    History reviewed. No pertinent past medical history. History reviewed. No pertinent past surgical history. History reviewed. No pertinent family history. Social History  Substance Use Topics  . Smoking status: Current Every Day Smoker -- 1.00 packs/day  . Smokeless tobacco: Never Used  . Alcohol Use: No    Review of Systems  Constitutional: Negative for fever and chills.  Respiratory: Negative for cough, shortness of breath and wheezing.   Gastrointestinal: Negative for nausea and vomiting.  Skin: Positive for rash. Negative for wound.    Allergies  Review of patient's allergies indicates no known allergies.  Home Medications   Prior to Admission medications   Medication Sig Start Date End Date Taking? Authorizing Provider  cetirizine (ZYRTEC) 10 MG tablet Take 1 tablet (10 mg total) by mouth daily. 06/26/16   Junius FinnerErin O'Malley, PA-C  predniSONE (DELTASONE) 10 MG tablet 6,5,4,3,2,1 04/07/16   Elson AreasLeslie K Sofia, PA-C  predniSONE (DELTASONE) 20 MG tablet 3 tabs po day one, then 2 po daily x 4 days 06/26/16   Junius FinnerErin O'Malley, PA-C   Meds Ordered and Administered this Visit   Medications  methylPREDNISolone  sodium succinate (SOLU-MEDROL) 40 mg/mL injection 80 mg (80 mg Intramuscular Given 06/26/16 1532)    BP 118/68 mmHg  Pulse 68  Temp(Src) 98 F (36.7 C) (Oral)  Resp 16  Ht 6\' 2"  (1.88 m)  Wt 165 lb (74.844 kg)  BMI 21.18 kg/m2  SpO2 94% No data found.   Physical Exam  Constitutional: He is oriented to person, place, and time. He appears well-developed and well-nourished.  HENT:  Head: Normocephalic and atraumatic.  Mouth/Throat: Oropharynx is clear and moist.  Eyes: EOM are normal.  Neck: Normal range of motion.  Cardiovascular: Normal rate.   Pulmonary/Chest: Effort normal. No respiratory distress.  Musculoskeletal: Normal range of motion.  Neurological: He is alert and oriented to person, place, and time.  Skin: Skin is warm and dry. Rash noted.  Scattered erythematous papules and vesicles. Linear pattern across his abdomen and left forearm. Scant yellow discharge from some of the vesicles. No red streaking or warmth.   Psychiatric: He has a normal mood and affect. His behavior is normal.  Nursing note and vitals reviewed.   ED Course  Procedures (including critical care time)  Labs Review Labs Reviewed - No data to display  Imaging Review No results found.   MDM   1. Contact dermatitis due to plant    Rash c/w contact dermatitis w/o underlying infection.   Tx in UC: Solumedrol 80mg  IM Rx: Prednisone and cetirizine. Encouraged to take cetirizine daily.   F/u with PCP in 1 week if not improving. May benefit from  f/u with dermatology or allergist with reports of multiple outbreaks every year.   Junius Finnerrin O'Malley, PA-C 06/26/16 1733

## 2016-06-26 NOTE — Discharge Instructions (Signed)
You were given a shot of Solumedrol (a steroid) today to help with itching and swelling from a likely allergic reaction.  You have been prescribed 5 days of prednisone, an oral steroid.  You may start this medication tomorrow with breakfast.    Please take the non-drowsy antihistamine, cetirizine (generic Zyrtec) as prescribed.  It is best to take once every single day, especially during the times of year you are working in the yard and exposed to poison ivy or poison oak as it can help reduce the allergic reaction and help with itching.  If you continue to have multiple outbreaks of rashes every year, you may benefit from allergy shots. Please follow up with an allergist for further evaluation and guidance to help prevent allergic reaction to the poison oak or poison ivy.     Poison [employer] ivy is a rash caused by touching the leaves of the poison ivy plant. The rash often shows up 48 hours later. You might just have bumps, redness, and itching. Sometimes, blisters appear and break open. Your eyes may get puffy (swollen). Poison ivy often heals in 2 to 3 weeks without treatment. HOME CARE  If you touch poison ivy:  Wash your skin with soap and water right away. Wash under your fingernails. Do not rub the skin very hard.  Wash any clothes you were wearing.  Avoid poison ivy in the future. Poison ivy has 3 leaves on a stem.  Use medicine to help with itching as told by your doctor. Do not drive when you take this medicine.  Keep open sores dry, clean, and covered with a bandage and medicated cream, if needed.  Ask your doctor about medicine for children. GET HELP RIGHT AWAY IF:  You have open sores.  Redness spreads beyond the area of the rash.  There is yellowish white fluid (pus) coming from the rash.  Pain gets worse.  You have a temperature by mouth above 102 F (38.9 C), not controlled by medicine. MAKE SURE YOU:  Understand these instructions.  Will watch your  condition.  Will get help right away if you are not doing well or get worse.   This information is not intended to replace advice given to you by your health care provider. Make sure you discuss any questions you have with your health care provider.   Document Released: 01/15/2011 Document Revised: 03/06/2012 Document Reviewed: 05/21/2015 Elsevier Interactive Patient Education 2016 [employer].  [facility] is a rash caused by touching the leaves of the poison [employer]. You may have a rash with redness and itching. Sometimes, blisters appear and break open. Your eyes may get puffy (swollen). Poison oak often heals in 2 to 3 weeks without treatment.  HOME CARE  If you touch poison oak:  Wash your skin with soap and water right away. Wash under your fingernails. Do not rub the skin very hard.  Wash any clothes you were wearing.  Avoid poison oak in the future. Poison oak usually has 3 leaves on a stem.  Use medicines to help with itching as told by your doctor. Do not drive when you take this medicine.  Keep open sores dry, clean, and covered with a bandage and medicated cream, if needed.  Ask your doctor about medicine for children. GET HELP RIGHT AWAY IF:  You have open sores.  Redness spreads beyond the area of the rash.  There is yellowish white fluid (pus) coming from the rash.  Pain gets  worse.  You have a temperature by mouth above 102 F (38.9 C), not controlled by medicine. MAKE SURE YOU:  Understand these instructions.  Will watch your condition.  Will get help right away if you are not doing well or get worse.   This information is not intended to replace advice given to you by your health care provider. Make sure you discuss any questions you have with your health care provider.   Document Released: 01/15/2011 Document Revised: 03/06/2012 Document Reviewed: 05/21/2015 Elsevier Interactive Patient Education 2016 Elsevier Inc.  Contact  Dermatitis Dermatitis is redness, soreness, and swelling (inflammation) of the skin. Contact dermatitis is a reaction to certain substances that touch the skin. There are two types of contact dermatitis:   Irritant contact dermatitis. This type is caused by something that irritates your skin, such as dry hands from washing them too much. This type does not require previous exposure to the substance for a reaction to occur. This type is more common.  Allergic contact dermatitis. This type is caused by a substance that you are allergic to, such as a nickel allergy or poison ivy. This type only occurs if you have been exposed to the substance (allergen) before. Upon a repeat exposure, your body reacts to the substance. This type is less common. CAUSES  Many different substances can cause contact dermatitis. Irritant contact dermatitis is most commonly caused by exposure to:   Makeup.   Soaps.   Detergents.   Bleaches.   Acids.   Metal salts, such as nickel.  Allergic contact dermatitis is most commonly caused by exposure to:   Poisonous plants.   Chemicals.   Jewelry.   Latex.   Medicines.   Preservatives in products, such as clothing.  RISK FACTORS This condition is more likely to develop in:   People who have jobs that expose them to irritants or allergens.  People who have certain medical conditions, such as asthma or eczema.  SYMPTOMS  Symptoms of this condition may occur anywhere on your body where the irritant has touched you or is touched by you. Symptoms include:  Dryness or flaking.   Redness.   Cracks.   Itching.   Pain or a burning feeling.   Blisters.  Drainage of small amounts of blood or clear fluid from skin cracks. With allergic contact dermatitis, there may also be swelling in areas such as the eyelids, mouth, or genitals.  DIAGNOSIS  This condition is diagnosed with a medical history and physical exam. A patch skin test may be  performed to help determine the cause. If the condition is related to your job, you may need to see an occupational medicine specialist. TREATMENT Treatment for this condition includes figuring out what caused the reaction and protecting your skin from further contact. Treatment may also include:   Steroid creams or ointments. Oral steroid medicines may be needed in more severe cases.  Antibiotics or antibacterial ointments, if a skin infection is present.  Antihistamine lotion or an antihistamine taken by mouth to ease itching.  A bandage (dressing). HOME CARE INSTRUCTIONS Skin Care  Moisturize your skin as needed.   Apply cool compresses to the affected areas.  Try taking a bath with:  Epsom salts. Follow the instructions on the packaging. You can get these at your local pharmacy or grocery store.  Baking soda. Pour a small amount into the bath as directed by your health care provider.  Colloidal oatmeal. Follow the instructions on the packaging. You can get  this at your local pharmacy or grocery store.  Try applying baking soda paste to your skin. Stir water into baking soda until it reaches a paste-like consistency.  Do not scratch your skin.  Bathe less frequently, such as every other day.  Bathe in lukewarm water. Avoid using hot water. Medicines  Take or apply over-the-counter and prescription medicines only as told by your health care provider.   If you were prescribed an antibiotic medicine, take or apply your antibiotic as told by your health care provider. Do not stop using the antibiotic even if your condition starts to improve. General Instructions  Keep all follow-up visits as told by your health care provider. This is important.  Avoid the substance that caused your reaction. If you do not know what caused it, keep a journal to try to track what caused it. Write down:  What you eat.  What cosmetic products you use.  What you drink.  What you wear  in the affected area. This includes jewelry.  If you were given a dressing, take care of it as told by your health care provider. This includes when to change and remove it. SEEK MEDICAL CARE IF:   Your condition does not improve with treatment.  Your condition gets worse.  You have signs of infection such as swelling, tenderness, redness, soreness, or warmth in the affected area.  You have a fever.  You have new symptoms. SEEK IMMEDIATE MEDICAL CARE IF:   You have a severe headache, neck pain, or neck stiffness.  You vomit.  You feel very sleepy.  You notice red streaks coming from the affected area.  Your bone or joint underneath the affected area becomes painful after the skin has healed.  The affected area turns darker.  You have difficulty breathing.   This information is not intended to replace advice given to you by your health care provider. Make sure you discuss any questions you have with your health care provider.   Document Released: 12/10/2000 Document Revised: 09/03/2015 Document Reviewed: 04/30/2015 Elsevier Interactive Patient Education [employer]2016 Elsevier Inc.

## 2016-06-26 NOTE — ED Notes (Signed)
In wooded area 2 days ago; now has scattered rash in all body areas except face.

## 2016-07-26 ENCOUNTER — Emergency Department
Admission: EM | Admit: 2016-07-26 | Discharge: 2016-07-26 | Disposition: A | Discharge status: Home / Self Care | Payer: Medicaid Other | Source: Home / Self Care | Attending: Emergency Medicine | Admitting: Emergency Medicine

## 2016-07-26 ENCOUNTER — Encounter: Payer: Self-pay | Admitting: *Deleted

## 2016-07-26 DIAGNOSIS — L259 Unspecified contact dermatitis, unspecified cause: Secondary | ICD-10-CM | POA: Diagnosis not present

## 2016-07-26 MED ORDER — HYDROXYZINE HCL 25 MG PO TABS: ORAL_TABLET | ORAL | 0 refills | Status: DC

## 2016-07-26 MED ORDER — METHYLPREDNISOLONE ACETATE 80 MG/ML IJ SUSP
80.0000 mg | Freq: Once | INTRAMUSCULAR | Status: AC
  Administered 2016-07-26: 80 mg via INTRAMUSCULAR

## 2016-07-26 MED ORDER — PREDNISONE 10 MG PO TABS: ORAL_TABLET | ORAL | 0 refills | Status: DC

## 2016-07-26 MED ORDER — PREDNISONE 10 MG (21) PO TBPK: ORAL_TABLET | ORAL | 0 refills | Status: DC

## 2016-07-26 MED ORDER — HYDROXYZINE HCL 25 MG PO TABS: 25.0000 mg | ORAL_TABLET | Freq: Once | ORAL | 0 refills | Status: AC

## 2016-07-26 NOTE — ED Triage Notes (Signed)
Pt c/o rash on his LT arm, groin area and LT side of his neck x today.

## 2016-07-26 NOTE — ED Provider Notes (Signed)
[name]    CSN: [id number] Arrival date & time: 07/26/16  4098  First Provider Contact:  None       History   Chief Complaint Chief Complaint  Patient presents with  . Rash    HPI [name] is a 40 y.o. male.   HPI Onset today of severe pruritic poison ivy rash on arms progressing to neck and to face. OTC meds not helping. No associated chest pain or shortness of breath or dysphagia or problems with eyes or other ENT symptoms. No nausea or vomiting or fever or chills. History reviewed. No pertinent past medical history.  Patient Active Problem List   Diagnosis Date Noted  . BRONCHITIS, ACUTE WITH BRONCHOSPASM 10/11/2011  . GASTROENTERITIS 06/08/2011  . NAUSEA AND VOMITING 06/08/2011  . ABDOMINAL PAIN, UNSPECIFIED SITE 06/08/2011  . CONTACT DERMATITIS 05/25/2011  . ASTHMA 05/20/2011  . SHOULDER PAIN, LEFT 03/17/2011  . ACUTE SINUSITIS, UNSPECIFIED 05/06/2010  . WEIGHT LOSS, RECENT 05/06/2010    History reviewed. No pertinent surgical history.     Home Medications    Prior to Admission medications   Medication Sig Start Date End Date Taking? Authorizing Provider  hydrOXYzine (ATARAX/VISTARIL) 25 MG tablet Take 1 tablet (25 mg total) by mouth once. Take 1 at bedtime as needed for itch. May cause drowsiness. 07/26/16 07/26/16  Lajean Manes, MD  predniSONE (DELTASONE) 10 MG tablet Take as directed for 6 days.--Take 6 on day 1, 5 on day 2, 4 on day 3, then 3 tablets on day 4, then 2 tablets on day 5, then 1 on day 6. 07/26/16   Lajean Manes, MD    Family History History reviewed. No pertinent family history.  Social History Social History  Substance Use Topics  . Smoking status: Current Every Day Smoker    Packs/day: 1.00  . Smokeless tobacco: Never Used  . Alcohol use No     Allergies   Review of patient's allergies indicates no known allergies.   Review of Systems Review of Systems  All other systems reviewed and are  negative.    Physical Exam Triage Vital Signs ED Triage Vitals  Enc Vitals Group     BP 07/26/16 1819 123/76     Pulse Rate 07/26/16 1819 72     Resp 07/26/16 1819 16     Temp 07/26/16 1819 97.6 F (36.4 C)     Temp Source 07/26/16 1819 Oral     SpO2 07/26/16 1819 99 %     Weight 07/26/16 1819 165 lb (74.8 kg)     Height 07/26/16 1819  (1.88 m)     Head Circumference --      Peak Flow --      Pain Score 07/26/16 1822 0     Pain Loc --      Pain Edu? --      Excl. in GC? --    No data found.   Updated Vital Signs BP 123/76 (BP Location: Left Arm)   Pulse 72   Temp 97.6 F (36.4 C) (Oral)   Resp 16   Ht  (1.88 m)   Wt 165 lb (74.8 kg)   SpO2 99%   BMI 21.18 kg/m   Visual Acuity Right Eye Distance:   Left Eye Distance:   Bilateral Distance:    Right Eye Near:   Left Eye Near:    Bilateral Near:     Physical Exam  Constitutional: He is oriented to person, place,  and time. He appears well-developed and well-nourished. No distress.  Uncomfortable from pruritic rash  HENT:  Head: Normocephalic and atraumatic.  Eyes: Pupils are equal, round, and reactive to light. No scleral icterus.  Neck: Normal range of motion. Neck supple.  Cardiovascular: Normal rate, regular rhythm and normal heart sounds.   Pulmonary/Chest: Effort normal and breath sounds normal.  Abdominal: He exhibits no distension.  Neurological: He is alert and oriented to person, place, and time.  Skin: Skin is warm and dry. Rash noted.  Severe erythematous poison ivy type rash, some vesicles, some clusters, both upper extremities and neck and few areas on face.  Psychiatric: He has a normal mood and affect. His behavior is normal.     UC Treatments / Results  Labs (all labs ordered are listed, but only abnormal results are displayed) Labs Reviewed - No data to display  EKG  EKG Interpretation None       Radiology No results found.  Procedures Procedures (including  critical care time)  Medications Ordered in UC Medications  methylPREDNISolone acetate (DEPO-MEDROL) injection 80 mg (not administered)     Initial Impression / Assessment and Plan / UC Course  I have reviewed the triage vital signs and the nursing notes.  Pertinent labs & imaging results that were available during my care of the patient were reviewed by me and considered in my medical decision making (see chart for details).  Clinical Course    Severe contact dermatitis, likely from poison ivy by history. He gets severe reactions from poison ivy, so I we'll treat aggressively. Treatment options discussed, as well as risks, benefits, alternatives. Patient voiced understanding and agreement with the following plans: Depo-Medrol 80 mg IM stat Six-day prednisone Dosepak Hydroxyzine as needed for severe itch, drowsiness precautions discussed Other symptomatic care discussed  Follow-up with your primary care doctor in 5-7 days if not improving, or sooner if symptoms become worse. Precautions discussed. Red flags discussed. Questions invited and answered. Patient voiced understanding and agreement.   Final Clinical Impressions(s) / UC Diagnoses   Final diagnoses:  Contact dermatitis    New Prescriptions New Prescriptions   HYDROXYZINE (ATARAX/VISTARIL) 25 MG TABLET    Take 1 tablet (25 mg total) by mouth once. Take 1 at bedtime as needed for itch. May cause drowsiness.   PREDNISONE (DELTASONE) 10 MG TABLET    Take as directed for 6 days.--Take 6 on day 1, 5 on day 2, 4 on day 3, then 3 tablets on day 4, then 2 tablets on day 5, then 1 on day 6.     Lajean Manes, MD 07/26/16 [phone]

## 2016-08-23 ENCOUNTER — Emergency Department
Admission: EM | Admit: 2016-08-23 | Discharge: 2016-08-23 | Discharge status: Absent without leave | Payer: Medicaid Other | Source: Home / Self Care

## 2016-08-24 ENCOUNTER — Encounter: Payer: Self-pay | Admitting: *Deleted

## 2016-08-24 ENCOUNTER — Emergency Department
Admission: EM | Admit: 2016-08-24 | Discharge: 2016-08-24 | Disposition: A | Discharge status: Home / Self Care | Payer: Medicaid Other | Source: Home / Self Care | Attending: Family Medicine | Admitting: Family Medicine

## 2016-08-24 DIAGNOSIS — L255 Unspecified contact dermatitis due to plants, except food: Secondary | ICD-10-CM

## 2016-08-24 MED ORDER — METHYLPREDNISOLONE ACETATE 80 MG/ML IJ SUSP
80.0000 mg | Freq: Once | INTRAMUSCULAR | Status: AC
  Administered 2016-08-24: 80 mg via INTRAMUSCULAR

## 2016-08-24 MED ORDER — HYDROXYZINE HCL 25 MG PO TABS: 25.0000 mg | ORAL_TABLET | Freq: Four times a day (QID) | ORAL | 0 refills | Status: DC

## 2016-08-24 MED ORDER — PREDNISONE 10 MG PO TABS: ORAL_TABLET | ORAL | 0 refills | Status: DC

## 2016-08-24 NOTE — ED Provider Notes (Signed)
[name]    CSN: [id number] Arrival date & time: 08/24/16  1632  First Provider Contact:  First MD Initiated Contact with Patient 08/24/16 1647        History   Chief Complaint Chief Complaint  Patient presents with  . Rash    HPI [name] is a 40 y.o. male.   Patient contacted poison ivy three days ago, and now has persistent pruritic rash on much of his body except right arm.  He feels well otherwise.   The history is provided by the patient.  Poison [place]  This is a recurrent problem. Episode onset: 3 days ago. The problem occurs constantly. The problem has been gradually worsening. Associated symptoms comments: none. Exacerbated by: sweating. Nothing relieves the symptoms. He has tried nothing for the symptoms.    History reviewed. No pertinent past medical history.  Patient Active Problem List   Diagnosis Date Noted  . BRONCHITIS, ACUTE WITH BRONCHOSPASM 10/11/2011  . GASTROENTERITIS 06/08/2011  . NAUSEA AND VOMITING 06/08/2011  . ABDOMINAL PAIN, UNSPECIFIED SITE 06/08/2011  . CONTACT DERMATITIS 05/25/2011  . ASTHMA 05/20/2011  . SHOULDER PAIN, LEFT 03/17/2011  . ACUTE SINUSITIS, UNSPECIFIED 05/06/2010  . WEIGHT LOSS, RECENT 05/06/2010    History reviewed. No pertinent surgical history.     Home Medications    Prior to Admission medications   Medication Sig Start Date End Date Taking? Authorizing Provider  hydrOXYzine (ATARAX/VISTARIL) 25 MG tablet Take 1 tablet (25 mg total) by mouth every 6 (six) hours. For itching and rash 08/24/16   Lattie Haw, MD  predniSONE (DELTASONE) 10 MG tablet Take as directed PO:  6 tabs on day 1, 5 on day 2, 4 on day 3, 3 on day 4, 2 on day 5, 1 on day 6 08/24/16   Lattie Haw, MD    Family History History reviewed. No pertinent family history.  Social History Social History  Substance Use Topics  . Smoking status: Current Every Day Smoker    Packs/day: 1.00  . Smokeless tobacco: Never Used    . Alcohol use No     Allergies   Review of patient's allergies indicates no known allergies.   Review of Systems Review of Systems  All other systems reviewed and are negative.    Physical Exam Triage Vital Signs ED Triage Vitals  Enc Vitals Group     BP 08/24/16 1647 148/90     Pulse Rate 08/24/16 1647 69     Resp 08/24/16 1647 16     Temp 08/24/16 1647 97.4 F (36.3 C)     Temp Source 08/24/16 1647 Oral     SpO2 08/24/16 1647 99 %     Weight 08/24/16 1647 163 lb (73.9 kg)     Height 08/24/16 1647 6\' 2"  (1.88 m)     Head Circumference --      Peak Flow --      Pain Score 08/24/16 1648 0     Pain Loc --      Pain Edu? --      Excl. in GC? --    No data found.   Updated Vital Signs BP 148/90 (BP Location: Left Arm)   Pulse 69   Temp 97.4 F (36.3 C) (Oral)   Resp 16   Ht 6\' 2"  (1.88 m)   Wt 163 lb (73.9 kg)   SpO2 99%   BMI 20.93 kg/m   Visual Acuity Right Eye Distance:   Left Eye  Distance:   Bilateral Distance:    Right Eye Near:   Left Eye Near:    Bilateral Near:     Physical Exam  Constitutional: He appears well-developed and well-nourished. No distress.  HENT:  Head: Atraumatic.  Right Ear: External ear normal.  Left Ear: External ear normal.  Nose: Nose normal.  Mouth/Throat: Oropharynx is clear and moist.  Eyes: Conjunctivae are normal. Pupils are equal, round, and reactive to light.  Neck: Neck supple.  Cardiovascular: Normal heart sounds.   Pulmonary/Chest: Breath sounds normal.  Abdominal: There is no tenderness.  Musculoskeletal: He exhibits no edema.  Lymphadenopathy:    He has no cervical adenopathy.  Neurological: He is alert.  Skin:     Diffusely scattered maculopapular eruption on torso and extremities as noted on diagram.  No vesiculation.  No warmth or tenderness to palpation.  Nursing note and vitals reviewed.    UC Treatments / Results  Labs (all labs ordered are listed, but only abnormal results are  displayed) Labs Reviewed - No data to display  EKG  EKG Interpretation None       Radiology No results found.  Procedures Procedures (including critical care time)  Medications Ordered in UC Medications  methylPREDNISolone acetate (DEPO-MEDROL) injection 80 mg (80 mg Intramuscular Given 08/24/16 1655)     Initial Impression / Assessment and Plan / UC Course  I have reviewed the triage vital signs and the nursing notes.  Pertinent labs & imaging results that were available during my care of the patient were reviewed by me and considered in my medical decision making (see chart for details).  Clinical Course  Administered DepoMedrol 80mg  IM Begin prednisone taper  Rx for Atarax 25mg   Begin prednisone tomorrow:  Wednesday August 30 Followup with Family Doctor if not improved in one week.     Final Clinical Impressions(s) / UC Diagnoses   Final diagnoses:  Rhus dermatitis    New Prescriptions New Prescriptions   HYDROXYZINE (ATARAX/VISTARIL) 25 MG TABLET    Take 1 tablet (25 mg total) by mouth every 6 (six) hours. For itching and rash   PREDNISONE (DELTASONE) 10 MG TABLET    Take as directed PO:  6 tabs on day 1, 5 on day 2, 4 on day 3, 3 on day 4, 2 on day 5, 1 on day 6     Lattie HawStephen A Beese, MD 09/05/16 1520

## 2016-08-24 NOTE — Discharge Instructions (Signed)
Begin prednisone tomorrow:  Wednesday August 30

## 2016-08-24 NOTE — ED Triage Notes (Signed)
Pt c/o rash all over his body except his RT arm x 2 days.

## 2016-09-05 ENCOUNTER — Emergency Department (INDEPENDENT_AMBULATORY_CARE_PROVIDER_SITE_OTHER)
Admission: EM | Admit: 2016-09-05 | Discharge: 2016-09-05 | Disposition: A | Discharge status: Home / Self Care | Payer: Medicaid Other | Source: Home / Self Care | Attending: Family Medicine | Admitting: Family Medicine

## 2016-09-05 ENCOUNTER — Encounter: Payer: Self-pay | Admitting: Emergency Medicine

## 2016-09-05 DIAGNOSIS — H9203 Otalgia, bilateral: Secondary | ICD-10-CM

## 2016-09-05 MED ORDER — IBUPROFEN 600 MG PO TABS: 600.0000 mg | ORAL_TABLET | Freq: Four times a day (QID) | ORAL | 0 refills | Status: DC | PRN

## 2016-09-05 MED ORDER — FLUTICASONE PROPIONATE 50 MCG/ACT NA SUSP: 2.0000 | Freq: Every day | NASAL | 2 refills | Status: DC

## 2016-09-05 MED ORDER — CETIRIZINE HCL 10 MG PO TABS: 10.0000 mg | ORAL_TABLET | Freq: Every day | ORAL | 1 refills | Status: DC

## 2016-09-05 MED ORDER — PREDNISONE 20 MG PO TABS: ORAL_TABLET | ORAL | 0 refills | Status: DC

## 2016-09-05 NOTE — ED Provider Notes (Signed)
CSN: [id number]     Arrival date & time 09/05/16  1548 History   First MD Initiated Contact with Patient 09/05/16 1604     Chief Complaint  Patient presents with  . Otalgia   (Consider location/radiation/quality/duration/timing/severity/associated sxs/prior Treatment) HPI [name] is a 40 y.o. male presenting to UC with c/o Right ear pain for 1 week, gradually worsening, aching and throbbing, now developing Left ear pain for about 1 day but that went away.  Mild rhinorrhea.  He has not taken anything for his pain. Denies fever, chills, sore throat or cough. No sick contacts. 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   History reviewed. No pertinent past medical history. History reviewed. No pertinent surgical history. No family history on file. Social History  Substance Use Topics  . Smoking status: Current Every Day Smoker    Packs/day: 1.00  . Smokeless tobacco: Never Used  . Alcohol use No    Review of Systems  Constitutional: Negative for chills, fatigue and fever.  HENT: Positive for congestion ( mild), ear pain and rhinorrhea. Negative for ear discharge, sinus pressure and sore throat.   Respiratory: Negative for cough and shortness of breath.   Gastrointestinal: Negative for nausea and vomiting.  Skin: Negative for color change and rash.    Allergies  Review of patient's allergies indicates no known allergies.  Home Medications   Prior to Admission medications   Medication Sig Start Date End Date Taking? Authorizing Provider  cetirizine (ZYRTEC) 10 MG tablet Take 1 tablet (10 mg total) by mouth daily. 09/05/16   Junius FinnerErin O'Malley, PA-C  fluticasone (FLONASE) 50 MCG/ACT nasal spray Place 2 sprays into both nostrils daily. 09/05/16   Junius FinnerErin O'Malley, PA-C  ibuprofen (ADVIL,MOTRIN) 600 MG tablet Take 1 tablet (600 mg total) by mouth every 6 (six) hours as needed. 09/05/16   Junius FinnerErin  O'Malley, PA-C  predniSONE (DELTASONE) 20 MG tablet 3 tabs po day one, then 2 po daily x 4 days 09/05/16   Junius FinnerErin O'Malley, PA-C   Meds Ordered and Administered this Visit  Medications - No data to display  BP 118/64 (BP Location: Left Arm)   Pulse 69   Temp 97.9 F (36.6 C) (Oral)   Ht 6\' 2"  (1.88 m)   Wt 163 lb (73.9 kg)   SpO2 98%   BMI 20.93 kg/m  No data found.   Physical Exam  Constitutional: He is oriented to person, place, and time. He appears well-developed and well-nourished.  HENT:  Head: Normocephalic and atraumatic.  Right Ear: Tympanic membrane is injected.  Left Ear: Tympanic membrane normal.  Nose: Nose normal.  Mouth/Throat: Uvula is midline, oropharynx is clear and moist and mucous membranes are normal.  Eyes: EOM are normal.  Neck: Normal range of motion.  Cardiovascular: Normal rate and regular rhythm.   Pulmonary/Chest: Effort normal and breath sounds normal. No respiratory distress. He has no wheezes. He has no rales.  Musculoskeletal: Normal range of motion.  Neurological: He is alert and oriented to person, place, and time.  Skin: Skin is warm and dry.  Psychiatric: He has a normal mood and affect. His behavior is normal.  Nursing note and vitals reviewed.   Urgent Care Course   Clinical Course    Procedures (including critical care time)  Labs Review Labs Reviewed - No data to display  Imaging Review No results found.  Tympanometry: Left and Right ears- NORMAL  MDM   1. Otalgia, bilateral    Pt c/o ear pain, Right worse than Left. Tympanometry- normal  Will treat symptomatically.  Rx: Flonase, cetirizine, ibuprofen, and prednisone.  Home care instructions provided. F/u with PCP as needed. Patient verbalized understanding and agreement with treatment plan.    Junius Finner, PA-C 09/06/16 1012

## 2016-09-05 NOTE — ED Triage Notes (Signed)
Pt c/o right ear pain x 1 week.  Left ear hurt for about one day then went away.  Some runny nose, no other sxs.

## 2016-09-06 ENCOUNTER — Telehealth: Payer: Self-pay | Admitting: *Deleted

## 2016-09-06 NOTE — Telephone Encounter (Signed)
ENCOUNTER CREATED TO ADD TYMPANOGRAM ORDER AND DOCUMENT ON AS IT WAS NOT ON DOS. TYMPANOMETRY PERFORMED ON DOS 09/05/16

## 2016-12-11 ENCOUNTER — Emergency Department (INDEPENDENT_AMBULATORY_CARE_PROVIDER_SITE_OTHER)
Admission: EM | Admit: 2016-12-11 | Discharge: 2016-12-11 | Disposition: A | Discharge status: Home / Self Care | Payer: Medicaid Other | Source: Home / Self Care | Attending: Family Medicine | Admitting: Family Medicine

## 2016-12-11 DIAGNOSIS — H6983 Other specified disorders of Eustachian tube, bilateral: Secondary | ICD-10-CM

## 2016-12-11 DIAGNOSIS — J209 Acute bronchitis, unspecified: Secondary | ICD-10-CM

## 2016-12-11 DIAGNOSIS — R062 Wheezing: Secondary | ICD-10-CM

## 2016-12-11 MED ORDER — BENZONATATE 100 MG PO CAPS: 100.0000 mg | ORAL_CAPSULE | Freq: Three times a day (TID) | ORAL | 0 refills | Status: DC

## 2016-12-11 MED ORDER — PREDNISONE 20 MG PO TABS: ORAL_TABLET | ORAL | 0 refills | Status: DC

## 2016-12-11 MED ORDER — AMOXICILLIN-POT CLAVULANATE 875-125 MG PO TABS: 1.0000 | ORAL_TABLET | Freq: Two times a day (BID) | ORAL | 0 refills | Status: DC

## 2016-12-11 NOTE — ED Triage Notes (Signed)
Has had a head cold and congestion, since Wednesday every time he swallows, his ears are popping

## 2016-12-11 NOTE — ED Provider Notes (Signed)
CSN: [id number]     Arrival date & time 12/11/16  1321 History   First MD Initiated Contact with Patient 12/11/16 1354     Chief Complaint  Patient presents with  . Otalgia    ears popping when swallowing  . Cough   (Consider location/radiation/quality/duration/timing/severity/associated sxs/prior Treatment) HPI  [name] is a 40 y.o. male presenting to UC with c/o 1 week of sinus congestion, mild intermittent cough with sore throat and 3-4 day hx of bilateral ear pain and pressure with popping every time he swallows.  He has tried OTC cough/cold medication w/o relief. Denies fever, chills, n/v/d.   History reviewed. No pertinent past medical history. History reviewed. No pertinent surgical history. History reviewed. No pertinent family history. Social History  Substance Use Topics  . Smoking status: Current Every Day Smoker    Packs/day: 1.00  . Smokeless tobacco: Never Used  . Alcohol use No    Review of Systems  Constitutional: Negative for chills and fever.  HENT: Positive for congestion, ear pain ( bilateral), postnasal drip, rhinorrhea, sinus pain, sinus pressure and sore throat. Negative for trouble swallowing and voice change.   Respiratory: Positive for cough. Negative for shortness of breath.   Cardiovascular: Negative for chest pain and palpitations.  Gastrointestinal: Negative for abdominal pain, diarrhea, nausea and vomiting.  Musculoskeletal: Negative for arthralgias, back pain and myalgias.  Skin: Negative for rash.  Neurological: Positive for headaches. Negative for dizziness and light-headedness.    Allergies  Patient has no known allergies.  Home Medications   Prior to Admission medications   Medication Sig Start Date End Date Taking? Authorizing Provider  amoxicillin-clavulanate (AUGMENTIN) 875-125 MG tablet Take 1 tablet by mouth 2 (two) times daily. One po bid x 10 days 12/11/16   Junius FinnerErin O'Malley, PA-C  benzonatate (TESSALON) 100 MG capsule Take  1-2 capsules (100-200 mg total) by mouth every 8 (eight) hours. 12/11/16   Junius FinnerErin O'Malley, PA-C  cetirizine (ZYRTEC) 10 MG tablet Take 1 tablet (10 mg total) by mouth daily. 09/05/16   Junius FinnerErin O'Malley, PA-C  fluticasone (FLONASE) 50 MCG/ACT nasal spray Place 2 sprays into both nostrils daily. 09/05/16   Junius FinnerErin O'Malley, PA-C  ibuprofen (ADVIL,MOTRIN) 600 MG tablet Take 1 tablet (600 mg total) by mouth every 6 (six) hours as needed. 09/05/16   Junius FinnerErin O'Malley, PA-C  predniSONE (DELTASONE) 20 MG tablet 3 tabs po day one, then 2 po daily x 4 days 12/11/16   Junius FinnerErin O'Malley, PA-C   Meds Ordered and Administered this Visit  Medications - No data to display  BP 127/76 (BP Location: Left Arm)   Pulse 79   Temp 97.7 F (36.5 C) (Oral)   Ht 6\' 2"  (1.88 m)   Wt 160 lb 6.4 oz (72.8 kg)   SpO2 96%   BMI 20.59 kg/m  No data found.   Physical Exam  Constitutional: He appears well-developed and well-nourished. No distress.  HENT:  Head: Normocephalic and atraumatic.  Right Ear: Tympanic membrane is injected. A middle ear effusion is present.  Left Ear: Tympanic membrane is injected. A middle ear effusion is present.  Nose: Mucosal edema present. Right sinus exhibits no maxillary sinus tenderness and no frontal sinus tenderness. Left sinus exhibits no maxillary sinus tenderness and no frontal sinus tenderness.  Mouth/Throat: Uvula is midline, oropharynx is clear and moist and mucous membranes are normal.  Eyes: Conjunctivae are normal. No scleral icterus.  Neck: Normal range of motion. Neck supple.  Cardiovascular: Normal rate, regular rhythm  and normal heart sounds.   Pulmonary/Chest: Effort normal. No stridor. No respiratory distress. He has wheezes. He has rhonchi. He has no rales.  Faint diffuse wheeze and rhonchi noted on exam. No respiratory distress.   Musculoskeletal: Normal range of motion.  Lymphadenopathy:    He has cervical adenopathy.  Neurological: He is alert.  Skin: Skin is warm and dry. He  is not diaphoretic.  Nursing note and vitals reviewed.   Urgent Care Course   Clinical Course     Procedures (including critical care time)  Labs Review Labs Reviewed - No data to display  Imaging Review No results found.  Tympanometry: Negative Peak Pressure in Left and Right ears.   MDM   1. Acute bronchitis, unspecified organism   2. Wheeze   3. Eustachian tube dysfunction, bilateral    Pt c/o week of sinus congestion and URI symptoms with 3-4 days of worsening ear pain and pressure.  Tympanometry questionable for possible early AOM Rx: Augmentin, tessalon, and prednisone Encouraged to use his albuterol inhaler he has at home.  F/u with PCP in 1 week if not improving, sooner if worsening.    Junius Finnerrin O'Malley, PA-C 12/11/16 [phone]

## 2017-05-27 ENCOUNTER — Emergency Department (INDEPENDENT_AMBULATORY_CARE_PROVIDER_SITE_OTHER)
Admission: EM | Admit: 2017-05-27 | Discharge: 2017-05-27 | Disposition: A | Discharge status: Home / Self Care | Payer: BLUE CROSS/BLUE SHIELD | Source: Home / Self Care | Attending: Family Medicine | Admitting: Family Medicine

## 2017-05-27 ENCOUNTER — Encounter: Payer: Self-pay | Admitting: Emergency Medicine

## 2017-05-27 DIAGNOSIS — G8929 Other chronic pain: Secondary | ICD-10-CM

## 2017-05-27 DIAGNOSIS — J029 Acute pharyngitis, unspecified: Secondary | ICD-10-CM

## 2017-05-27 DIAGNOSIS — H9201 Otalgia, right ear: Secondary | ICD-10-CM

## 2017-05-27 LAB — POCT RAPID STREP A (OFFICE): Rapid Strep A Screen: NEGATIVE

## 2017-05-27 MED ORDER — AMOXICILLIN 500 MG PO CAPS: 500.0000 mg | ORAL_CAPSULE | Freq: Three times a day (TID) | ORAL | 0 refills | Status: DC

## 2017-05-27 NOTE — ED Triage Notes (Addendum)
[facility]. Patient states that he was eating crackers earlier and may have scratched his throat. C/O Ear Pain x 3 months

## 2017-05-27 NOTE — ED Provider Notes (Signed)
CSN: [id number]     Arrival date & time 05/27/17  1821 History   First MD Initiated Contact with Patient 05/27/17 1845     Chief Complaint  Patient presents with  . Sore Throat   (Consider location/radiation/quality/duration/timing/severity/associated sxs/prior Treatment) HPI  [name] is a 41 y.o. male presenting to UC with c/o sore throat that started earlier today while he was eating crackers. He thinks one of them scratched his throat but it has continued to hurt.  He is also c/o intermittent Right ear pain that has lasted about 3 months. Pt was evaluated a few months ago for same ear pain but was not found to have an ear infection and but not put on antibiotics at that time. He has had courses of prednisone to help with rash related to poison ivy but the prednisone did not help the ear pain.  Denies fever, chills, cough or congestion. No known sick contacts.   History reviewed. No pertinent past medical history. History reviewed. No pertinent surgical history. No family history on file. Social History  Substance Use Topics  . Smoking status: Current Every Day Smoker    Packs/day: 1.00  . Smokeless tobacco: Never Used  . Alcohol use No    Review of Systems  Constitutional: Negative for chills and fever.  HENT: Positive for ear pain (Right) and sore throat. Negative for congestion, postnasal drip and sinus pain.   Respiratory: Negative for cough, shortness of breath and wheezing.   Gastrointestinal: Negative for diarrhea, nausea and vomiting.  Neurological: Positive for headaches (mild). Negative for dizziness and light-headedness.    Allergies  Patient has no known allergies.  Home Medications   Prior to Admission medications   Medication Sig Start Date End Date Taking? Authorizing Provider  amoxicillin (AMOXIL) 500 MG capsule Take 1 capsule (500 mg total) by mouth 3 (three) times daily. 05/27/17   Junius Finner'Malley, Honour Schwieger, PA-C   Meds Ordered and Administered this Visit   Medications - No data to display  BP 124/81 (BP Location: Left Arm)   Pulse 76   Temp 98.1 F (36.7 C) (Oral)   Ht 6\' 2"  (1.88 m)   Wt 163 lb (73.9 kg)   SpO2 97%   BMI 20.93 kg/m  No data found.   Physical Exam  Constitutional: He is oriented to person, place, and time. He appears well-developed and well-nourished. No distress.  HENT:  Head: Normocephalic and atraumatic.  Right Ear: Tympanic membrane is not erythematous and not bulging. A middle ear effusion is present.  Left Ear: Tympanic membrane normal.  Nose: Nose normal.  Mouth/Throat: Uvula is midline and mucous membranes are normal. Posterior oropharyngeal erythema present. No oropharyngeal exudate, posterior oropharyngeal edema or tonsillar abscesses.  Eyes: EOM are normal.  Neck: Normal range of motion. Neck supple.  Cardiovascular: Normal rate and regular rhythm.   Pulmonary/Chest: Effort normal and breath sounds normal. No stridor. No respiratory distress. He has no wheezes. He has no rales.  Musculoskeletal: Normal range of motion.  Lymphadenopathy:    He has no cervical adenopathy.  Neurological: He is alert and oriented to person, place, and time.  Skin: Skin is warm and dry. He is not diaphoretic.  Psychiatric: He has a normal mood and affect. His behavior is normal.  Nursing note and vitals reviewed.   Urgent Care Course     Procedures (including critical care time)  Labs Review Labs Reviewed  STREP A DNA PROBE  POCT RAPID STREP A (OFFICE)  Imaging Review No results found.    MDM   1. Sore throat   2. Chronic right ear pain    Pt c/o sore throat for 1 day and intermittent Right ear pain for about 3 months.   Rapid strep: Negative  Will start on Amoxicillin for potential subacute Right AOM and pharyngitis. Strep culture pending.  If ear pain persists, f/u with PCP or ENT for further evaluation and treatment.     Junius Finner, PA-C 05/28/17 1025

## 2017-05-28 ENCOUNTER — Telehealth: Payer: Self-pay | Admitting: Emergency Medicine

## 2017-05-28 LAB — STREP A DNA PROBE: GASP: NOT DETECTED

## 2017-05-28 NOTE — Telephone Encounter (Signed)
Contacted patient to release test results , he is some better, encouraged to return if needed.

## 2018-01-18 ENCOUNTER — Encounter: Payer: Self-pay | Admitting: *Deleted

## 2018-01-18 ENCOUNTER — Other Ambulatory Visit: Payer: Self-pay

## 2018-01-18 ENCOUNTER — Emergency Department (INDEPENDENT_AMBULATORY_CARE_PROVIDER_SITE_OTHER)
Admission: EM | Admit: 2018-01-18 | Discharge: 2018-01-18 | Disposition: A | Discharge status: Home / Self Care | Payer: Worker's Compensation | Source: Home / Self Care | Attending: Family Medicine | Admitting: Family Medicine

## 2018-01-18 DIAGNOSIS — M940 Chondrocostal junction syndrome [Tietze]: Secondary | ICD-10-CM

## 2018-01-18 MED ORDER — PREDNISONE 20 MG PO TABS: ORAL_TABLET | ORAL | 0 refills | Status: DC

## 2018-01-18 MED ORDER — KETOROLAC TROMETHAMINE 60 MG/2ML IM SOLN
60.0000 mg | Freq: Once | INTRAMUSCULAR | Status: AC
  Administered 2018-01-18: 60 mg via INTRAMUSCULAR

## 2018-01-18 NOTE — ED Triage Notes (Signed)
Pt c/o LT sided chest pain x 4 days. Denies injury. He went to the ED Saturday with normal EKG; was given Tramadol and something for inflammation, but both cause drowsiness and he can not take due to work and he is a single father.

## 2018-01-18 NOTE — Discharge Instructions (Signed)
Apply ice pack for 20 to 30 minutes, 3 to 4 times daily  Continue until pain and swelling decrease.  °

## 2018-01-18 NOTE — ED Provider Notes (Signed)
[name]    CSN: [id number] Arrival date & time: 01/18/18  1701     History   Chief Complaint Chief Complaint  Patient presents with  . Chest Injury    HPI [name] is a 42 y.o. male.   Patient complains of left anterior chest pain for four days.  He was evaluated in the [facility] ED on 01/15/18 with completely negative cardiac workup.  He was prescribed Tramadol and Voltaren, but the meds cause too much drowsiness, and he is unable to perform his job which is quite physical.  He recalls no injury.  He denies fevers, chills, and sweats.  No cough or recent URI.   The history is provided by the patient.  Chest Pain  Pain location:  L chest Pain quality: stabbing   Pain radiates to:  Does not radiate Pain severity:  Moderate Onset quality:  Sudden Duration:  4 days Timing:  Constant Progression:  Worsening Chronicity:  New Context: breathing, lifting, movement and raising an arm   Context: not trauma   Relieved by:  Nothing Worsened by:  Certain positions, deep breathing and movement Ineffective treatments: Tramadol and Voltaren. Associated symptoms: no abdominal pain, no back pain, no cough, no diaphoresis, no dizziness, no dysphagia, no fatigue, no fever, no heartburn, no lower extremity edema, no palpitations, no shortness of breath and no syncope     History reviewed. No pertinent past medical history.  Patient Active Problem List   Diagnosis Date Noted  . BRONCHITIS, ACUTE WITH BRONCHOSPASM 10/11/2011  . GASTROENTERITIS 06/08/2011  . NAUSEA AND VOMITING 06/08/2011  . ABDOMINAL PAIN, UNSPECIFIED SITE 06/08/2011  . CONTACT DERMATITIS 05/25/2011  . ASTHMA 05/20/2011  . SHOULDER PAIN, LEFT 03/17/2011  . ACUTE SINUSITIS, UNSPECIFIED 05/06/2010  . WEIGHT LOSS, RECENT 05/06/2010    History reviewed. No pertinent surgical history.     Home Medications    Prior to Admission medications   Medication Sig Start Date End Date  Taking? Authorizing Provider  predniSONE (DELTASONE) 20 MG tablet Take one tab by mouth twice daily for 4 days, then one daily.  Take with food. 01/18/18   Lattie Haw, MD    Family History History reviewed. No pertinent family history.  Social History Social History   Tobacco Use  . Smoking status: Current Every Day Smoker    Packs/day: 1.00  . Smokeless tobacco: Never Used  Substance Use Topics  . Alcohol use: No  . Drug use: No     Allergies   Other   Review of Systems Review of Systems  Constitutional: Negative for diaphoresis, fatigue and fever.  HENT: Negative for trouble swallowing.   Respiratory: Negative for cough and shortness of breath.   Cardiovascular: Positive for chest pain. Negative for palpitations and syncope.  Gastrointestinal: Negative for abdominal pain and heartburn.  Musculoskeletal: Negative for back pain.  Neurological: Negative for dizziness.     Physical Exam Triage Vital Signs ED Triage Vitals [01/18/18 1748]  Enc Vitals Group     BP 136/86     Pulse Rate 82     Resp 16     Temp 98.2 F (36.8 C)     Temp Source Oral     SpO2 96 %     Weight 149 lb (67.6 kg)     Height 6\' 2"  (1.88 m)     Head Circumference      Peak Flow      Pain Score 8  Pain Loc      Pain Edu?      Excl. in GC?    No data found.  Updated Vital Signs BP 136/86 (BP Location: Right Arm)   Pulse 82   Temp 98.2 F (36.8 C) (Oral)   Resp 16   Ht 6\' 2"  (1.88 m)   Wt 149 lb (67.6 kg)   SpO2 96%   BMI 19.13 kg/m   Visual Acuity Right Eye Distance:   Left Eye Distance:   Bilateral Distance:    Right Eye Near:   Left Eye Near:    Bilateral Near:     Physical Exam  Constitutional: He appears well-developed and well-nourished. No distress.  HENT:  Head: Normocephalic and atraumatic.  Right Ear: External ear normal.  Left Ear: External ear normal.  Nose: Nose normal.  Mouth/Throat: Oropharynx is clear and moist.  Eyes: Conjunctivae are  normal. Pupils are equal, round, and reactive to light.  Neck: Normal range of motion.  Cardiovascular: Normal heart sounds.  Pulmonary/Chest: Breath sounds normal. No respiratory distress. He has no wheezes. He has no rales. He exhibits tenderness and bony tenderness. He exhibits no crepitus, no edema and no swelling.  Diffuse left anterior chest wall tenderness to palpation as noted on diagram.     Abdominal: There is no tenderness.  Musculoskeletal: He exhibits no edema or tenderness.  Lymphadenopathy:    He has no cervical adenopathy.  Neurological: He is alert.  Skin: Skin is warm and dry. No rash noted.  Nursing note and vitals reviewed.    UC Treatments / Results  Labs (all labs ordered are listed, but only abnormal results are displayed) Labs Reviewed - No data to display  EKG  EKG Interpretation None       Radiology No results found.  Procedures Procedures (including critical care time)  Medications Ordered in UC Medications  ketorolac (TORADOL) injection 60 mg (not administered)     Initial Impression / Assessment and Plan / UC Course  I have reviewed the triage vital signs and the nursing notes.  Pertinent labs & imaging results that were available during my care of the patient were reviewed by me and considered in my medical decision making (see chart for details).    Administered Toradol 60mg  IM  Begin prednisone burst/taper. Dispensed sling; patient is more comfortable with his left arm supported. Apply ice pack for 20 to 30 minutes, 3 to 4 times daily  Continue until pain and swelling decrease.   Followup with Dr. Rodney Langtonhomas Thekkekandam or Dr. Clementeen GrahamEvan Corey ([facility]) in 1 to 2 days.    Final Clinical Impressions(s) / UC Diagnoses   Final diagnoses:  Costochondritis    ED Discharge Orders        Ordered    predniSONE (DELTASONE) 20 MG tablet     01/18/18 1900          Lattie HawBeese, Stephen A, MD 01/19/18 [phone]

## 2018-01-19 ENCOUNTER — Encounter: Payer: BLUE CROSS/BLUE SHIELD | Admitting: Sports Medicine

## 2018-01-20 ENCOUNTER — Ambulatory Visit (INDEPENDENT_AMBULATORY_CARE_PROVIDER_SITE_OTHER): Payer: BLUE CROSS/BLUE SHIELD | Admitting: Sports Medicine

## 2018-01-20 ENCOUNTER — Encounter: Payer: Self-pay | Admitting: Sports Medicine

## 2018-01-20 DIAGNOSIS — R0789 Other chest pain: Secondary | ICD-10-CM | POA: Diagnosis not present

## 2018-01-20 DIAGNOSIS — R071 Chest pain on breathing: Secondary | ICD-10-CM | POA: Diagnosis not present

## 2018-01-20 MED ORDER — CYCLOBENZAPRINE HCL 10 MG PO TABS: ORAL_TABLET | ORAL | 0 refills | Status: DC

## 2018-01-20 NOTE — Progress Notes (Signed)
Subjective:    I'm seeing this patient as a consultation for: Dr. 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  He was then seen in urgent care, diagnosed with costochondritis and referred back to me.  Pain is severe, persistent, localized over the anterior upper chest wall at the anterior axillary line on the left.  Moderate shortness of breath, he is a prolific smoker.  Has lost 60 pounds in the past several years.  I reviewed the past medical history, family history, social history, surgical history, and allergies today and no changes were needed.  Please see the problem list section below in epic for further details.  Past Medical History: Past Medical History:  Diagnosis Date  . Asthma   . Depression    Past Surgical History: No past surgical history on file. Social History: Social History   Socioeconomic History  . Marital status: Legally Separated    Spouse name: None  . Number of children: 2  . Years of education: None  . Highest education level: None  Social Needs  . Financial resource strain: None  . Food insecurity - worry: None  . Food insecurity - inability: None  . Transportation needs - medical: None  . Transportation needs - non-medical: None  Occupational History  . None  Tobacco Use  . Smoking status: Current Every Day Smoker    Packs/day: 1.00  . Smokeless tobacco: Never Used  Substance and Sexual Activity  . Alcohol use: No  . Drug use: No  . Sexual activity: None  Other Topics Concern  . None  Social History Narrative  . None   Family History: No family history on file. Allergies: Allergies   Allergen Reactions  . Other     "sleeping pills"   Medications: See med rec.  Review of Systems: No headache, visual changes, nausea, vomiting, diarrhea, constipation, dizziness, abdominal pain, skin rash, fevers, chills, night sweats, weight loss, swollen lymph nodes, body aches, joint swelling, muscle aches, chest pain, shortness of breath, mood changes, visual or auditory hallucinations.   Objective:   General: Well Developed, well nourished, and in no acute distress.  Neuro:  Extra-ocular muscles intact, able to move all 4 extremities, sensation grossly intact.  Deep tendon reflexes tested were normal. Psych: Alert and oriented, mood congruent with affect. ENT:  Ears and nose appear unremarkable.  Hearing grossly normal. Neck: Unremarkable overall appearance, trachea midline.  No visible thyroid enlargement. Eyes: Conjunctivae and lids appear unremarkable.  Pupils equal and round. Skin: Warm and dry, no rashes noted.  Cardiovascular: Pulses palpable, no extremity edema. Chest wall: Tender to palpation over the third to fourth rib at the anterior axillary line.  I am unable to palpate any overt step-offs in the rib itself.  The chest wall was strapped with a compressive dressing.  Impression and Recommendations:   This case required medical decision making of moderate complexity.  Chest wall pain Chest x-ray overall unremarkable, exquisite chest pain left third or fourth rib, anterior axillary line. Swelling, minimal shortness of breath. Adding a chest CT without contrast. Strapping thorax. Flexeril given. Return to see me in  2 weeks. He has had a 60 pound weight loss over the past couple of years, normal CBC, CMP, glucose, TSH in the hospital a few days ago. He needs to finish the prednisone prescribed in urgent care. He needs to follow-up with his PCP regarding his weight loss.  ___________________________________________ Ihor Austinhomas J. Benjamin Stainhekkekandam, [name], [name], [name]. Primary  Care and Sports Medicine [facility]  Adjunct Instructor of Family Medicine  [employer]

## 2018-01-20 NOTE — Assessment & Plan Note (Addendum)
Chest x-ray overall unremarkable, exquisite chest pain left third or fourth rib, anterior axillary line. Swelling, minimal shortness of breath. Adding a chest CT without contrast. Strapping thorax. Flexeril given. Return to see me in 2 weeks. He has had a 60 pound weight loss over the past couple of years, normal CBC, CMP, glucose, TSH in the hospital a few days ago. He needs to finish the prednisone prescribed in urgent care. He needs to follow-up with his PCP regarding his weight loss.

## 2018-01-22 ENCOUNTER — Encounter: Payer: Self-pay | Admitting: Emergency Medicine

## 2018-01-22 ENCOUNTER — Emergency Department (INDEPENDENT_AMBULATORY_CARE_PROVIDER_SITE_OTHER)
Admission: EM | Admit: 2018-01-22 | Discharge: 2018-01-22 | Disposition: A | Discharge status: Home / Self Care | Payer: BLUE CROSS/BLUE SHIELD | Source: Home / Self Care | Attending: Family Medicine | Admitting: Family Medicine

## 2018-01-22 ENCOUNTER — Telehealth: Payer: Self-pay

## 2018-01-22 DIAGNOSIS — J101 Influenza due to other identified influenza virus with other respiratory manifestations: Secondary | ICD-10-CM

## 2018-01-22 LAB — POCT INFLUENZA A/B
Influenza A, POC: POSITIVE — AB
Influenza B, POC: NEGATIVE

## 2018-01-22 MED ORDER — OSELTAMIVIR PHOSPHATE 75 MG PO CAPS: 75.0000 mg | ORAL_CAPSULE | Freq: Two times a day (BID) | ORAL | 0 refills | Status: DC

## 2018-01-22 NOTE — ED Provider Notes (Signed)
[name]    CSN: [id number] Arrival date & time: 01/22/18  1128     History   Chief Complaint Chief Complaint  Patient presents with  . URI    HPI DESMON Spencer is a 42 y.o. male.   HPI  [name] is a 42 y.o. male presenting to UC with c/o sudden onset body aches, rhinorrhea, generalized HA, non-productive cough and chills since yesterday. His son was dx with the flu last week and he just found out his mother was hospitalized for the flu.  He has been taking Dayquil and Nyquil with temporary relief.  Denies n/v/d. No difficulty breathing.    Past Medical History:  Diagnosis Date  . Asthma   . Depression     Patient Active Problem List   Diagnosis Date Noted  . Chest wall pain 01/20/2018  . BRONCHITIS, ACUTE WITH BRONCHOSPASM 10/11/2011  . GASTROENTERITIS 06/08/2011  . NAUSEA AND VOMITING 06/08/2011  . ABDOMINAL PAIN, UNSPECIFIED SITE 06/08/2011  . CONTACT DERMATITIS 05/25/2011  . ASTHMA 05/20/2011  . SHOULDER PAIN, LEFT 03/17/2011  . ACUTE SINUSITIS, UNSPECIFIED 05/06/2010  . WEIGHT LOSS, RECENT 05/06/2010    History reviewed. No pertinent surgical history.     Home Medications    Prior to Admission medications   Medication Sig Start Date End Date Taking? Authorizing Provider  oseltamivir (TAMIFLU) 75 MG capsule Take 1 capsule (75 mg total) by mouth every 12 (twelve) hours. 01/22/18   Lurene Shadow, PA-C    Family History No family history on file.  Social History Social History   Tobacco Use  . Smoking status: Current Every Day Smoker    Packs/day: 1.00  . Smokeless tobacco: Never Used  Substance Use Topics  . Alcohol use: No  . Drug use: No     Allergies   Other   Review of Systems Review of Systems  Constitutional: Positive for chills, fatigue and fever ( subjective).  HENT: Positive for congestion and rhinorrhea. Negative for ear pain, sore throat, trouble swallowing and voice change.   Respiratory: Positive for  cough. Negative for shortness of breath.   Cardiovascular: Negative for chest pain and palpitations.  Gastrointestinal: Negative for abdominal pain, diarrhea, nausea and vomiting.  Musculoskeletal: Positive for arthralgias, back pain and myalgias.  Skin: Negative for rash.  Neurological: Positive for headaches. Negative for dizziness and light-headedness.     Physical Exam Triage Vital Signs ED Triage Vitals  Enc Vitals Group     BP 01/22/18 1150 121/79     Pulse Rate 01/22/18 1150 75     Resp --      Temp 01/22/18 1150 97.9 F (36.6 C)     Temp Source 01/22/18 1150 Oral     SpO2 01/22/18 1150 98 %     Weight 01/22/18 1151 152 lb 12 oz (69.3 kg)     Height 01/22/18 1151 6\' 2"  (1.88 m)     Head Circumference --      Peak Flow --      Pain Score 01/22/18 1150 6     Pain Loc --      Pain Edu? --      Excl. in GC? --    No data found.  Updated Vital Signs BP 121/79 (BP Location: Right Arm)   Pulse 75   Temp 97.9 F (36.6 C) (Oral)   Ht 6\' 2"  (1.88 m)   Wt 152 lb 12 oz (69.3 kg)   SpO2 98%  BMI 19.61 kg/m   Visual Acuity Right Eye Distance:   Left Eye Distance:   Bilateral Distance:    Right Eye Near:   Left Eye Near:    Bilateral Near:     Physical Exam  Constitutional: He is oriented to person, place, and time. He appears well-developed and well-nourished. No distress.  Pt lying on exam bed, appears acutely ill but is alert and cooperative during exam.  HENT:  Head: Normocephalic and atraumatic.  Right Ear: Tympanic membrane normal.  Left Ear: Tympanic membrane normal.  Nose: Nose normal.  Mouth/Throat: Uvula is midline, oropharynx is clear and moist and mucous membranes are normal.  Eyes: EOM are normal.  Neck: Normal range of motion. Neck supple.  Cardiovascular: Normal rate and regular rhythm.  Pulmonary/Chest: Effort normal and breath sounds normal. No stridor. No respiratory distress. He has no wheezes. He has no rales.  Abdominal: Soft. He exhibits  no distension. There is no tenderness.  Musculoskeletal: Normal range of motion.  Lymphadenopathy:    He has no cervical adenopathy.  Neurological: He is alert and oriented to person, place, and time.  Skin: Skin is warm and dry. No rash noted. He is not diaphoretic.  Psychiatric: He has a normal mood and affect. His behavior is normal.  Nursing note and vitals reviewed.    UC Treatments / Results  Labs (all labs ordered are listed, but only abnormal results are displayed) Labs Reviewed - No data to display  EKG  EKG Interpretation None       Radiology No results found.  Procedures Procedures (including critical care time)  Medications Ordered in UC Medications - No data to display   Initial Impression / Assessment and Plan / UC Course  I have reviewed the triage vital signs and the nursing notes.  Pertinent labs & imaging results that were available during my care of the patient were reviewed by me and considered in my medical decision making (see chart for details).     Rapid flu: POSITIVE Flu A Will start on Tamiflu Encouraged fluids and rest May continue Dayquil and Nyquil. May add ibuprofen to help with body aches F/u with PCP in 1 week if not improving, sooner if significantly worsening.   Final Clinical Impressions(s) / UC Diagnoses   Final diagnoses:  Influenza A    ED Discharge Orders        Ordered    oseltamivir (TAMIFLU) 75 MG capsule  Every 12 hours     01/22/18 1209       Controlled Substance Prescriptions [place] Controlled Substance Registry consulted? Not Applicable   Rolla Platehelps, [place] Heatherly O, PA-C 01/22/18 1304

## 2018-01-22 NOTE — Telephone Encounter (Signed)
Encounter opened for order

## 2018-01-22 NOTE — Discharge Instructions (Signed)
°  You may take 500mg  acetaminophen every 4-6 hours or in combination with ibuprofen 400-600mg  every 6-8 hours as needed for pain, inflammation, and fever.  You may continue to take over the counter Dayquil and Nyquil.  This medication already has acetaminophen (Tylenol) in it, but you can add ibuprofen (Motrin or Advil) to help with body aches and chills.   Be sure to drink at least eight 8oz glasses of water to stay well hydrated and get at least 8 hours of sleep at night, preferably more while sick.

## 2018-01-22 NOTE — ED Triage Notes (Signed)
Patient complaining of non-productive cough, body aches, runny nose, HA, no ear pain

## 2018-01-23 ENCOUNTER — Ambulatory Visit (INDEPENDENT_AMBULATORY_CARE_PROVIDER_SITE_OTHER): Payer: Self-pay

## 2018-01-23 ENCOUNTER — Telehealth: Payer: Self-pay | Admitting: *Deleted

## 2018-01-23 DIAGNOSIS — R0789 Other chest pain: Secondary | ICD-10-CM

## 2018-01-23 DIAGNOSIS — R911 Solitary pulmonary nodule: Secondary | ICD-10-CM

## 2018-01-23 DIAGNOSIS — R071 Chest pain on breathing: Secondary | ICD-10-CM

## 2018-01-23 MED ORDER — PREDNISONE 50 MG PO TABS: ORAL_TABLET | ORAL | 0 refills | Status: DC

## 2018-01-23 NOTE — Telephone Encounter (Signed)
Sent in prednisone

## 2018-01-23 NOTE — Telephone Encounter (Signed)
Called pt and lvm informing him that rx has been sent.[name], [name], CMA

## 2018-01-23 NOTE — Telephone Encounter (Signed)
Pt was in radiology and informed the tech that he was still in pain since yesterday. Wanted to know if Dr. Karie Schwalbe would send something in or he should be seen. Will fwd to Dr. Karie Schwalbe for advice.Loralee PacasBarkley, [name] [place]

## 2018-02-03 ENCOUNTER — Encounter: Payer: Self-pay | Admitting: Sports Medicine

## 2018-02-03 ENCOUNTER — Ambulatory Visit (INDEPENDENT_AMBULATORY_CARE_PROVIDER_SITE_OTHER): Payer: BLUE CROSS/BLUE SHIELD | Admitting: Sports Medicine

## 2018-02-03 DIAGNOSIS — R918 Other nonspecific abnormal finding of lung field: Secondary | ICD-10-CM

## 2018-02-03 DIAGNOSIS — F418 Other specified anxiety disorders: Secondary | ICD-10-CM

## 2018-02-03 DIAGNOSIS — R0789 Other chest pain: Secondary | ICD-10-CM | POA: Diagnosis not present

## 2018-02-03 MED ORDER — MIRTAZAPINE 15 MG PO TABS: 15.0000 mg | ORAL_TABLET | Freq: Every day | ORAL | 3 refills | Status: DC

## 2018-02-03 MED ORDER — TRAMADOL HCL 50 MG PO TABS: ORAL_TABLET | ORAL | 0 refills | Status: DC

## 2018-02-03 NOTE — Assessment & Plan Note (Signed)
Currently going through a divorce, establish care with another provider, they referred him to psychiatry but he is not being treated right now. I do think his concurrent anxiety and depression is interfering with our treatment of his pain. He also needs to gain some weight, adding mirtazapine 15 mg daily.

## 2018-02-03 NOTE — Assessment & Plan Note (Signed)
30% better, continue strapping, continue out of work. Increasing tramadol dose. Return in 1 month.

## 2018-02-03 NOTE — Assessment & Plan Note (Signed)
Needs follow-up chest CT in a year, he will do this with his PCP.

## 2018-02-03 NOTE — Progress Notes (Signed)
  Subjective:    CC: Follow-up  HPI: Chest wall pain: 30% improved with chest strapping, time, and being out of work.  We did a CT scan that showed no structural abnormalities in the chest itself.  Still out of work.  Depression: Currently going through divorce, severe depression, anxiety with panic attacks, no suicidal or homicidal ideation,  I reviewed the past medical history, family history, social history, surgical history, and allergies today and no changes were needed.  Please see the problem list section below in epic for further details.  Past Medical History: Past Medical History:  Diagnosis Date  . Asthma   . Depression    Past Surgical History: No past surgical history on file. Social History: Social History   Socioeconomic History  . Marital status: Legally Separated    Spouse name: None  . Number of children: 2  . Years of education: None  . Highest education level: None  Social Needs  . Financial resource strain: None  . Food insecurity - worry: None  . Food insecurity - inability: None  . Transportation needs - medical: None  . Transportation needs - non-medical: None  Occupational History  . None  Tobacco Use  . Smoking status: Current Every Day Smoker    Packs/day: 1.00  . Smokeless tobacco: Never Used  Substance and Sexual Activity  . Alcohol use: No  . Drug use: No  . Sexual activity: None  Other Topics Concern  . None  Social History Narrative  . None   Family History: No family history on file. Allergies: Allergies  Allergen Reactions  . Other     "sleeping pills"   Medications: See med rec.  Review of Systems: No fevers, chills, night sweats, weight loss, chest pain, or shortness of breath.   Objective:    General: Well Developed, well nourished, and in no acute distress.  Neuro: Alert and oriented x3, extra-ocular muscles intact, sensation grossly intact.  HEENT: Normocephalic, atraumatic, pupils equal round reactive to light,  neck supple, no masses, no lymphadenopathy, thyroid nonpalpable.  Skin: Warm and dry, no rashes. Cardiac: Regular rate and rhythm, no murmurs rubs or gallops, no lower extremity edema.  Respiratory: Clear to auscultation bilaterally. Not using accessory muscles, speaking in full sentences. Chest wall: Still with tenderness to palpation over the anterior chest wall, anterior axillary line, no palpable crepitus, step-offs.  Impression and Recommendations:    Depression with anxiety Currently going through a divorce, establish care with another provider, they referred him to psychiatry but he is not being treated right now. I do think his concurrent anxiety and depression is interfering with our treatment of his pain. He also needs to gain some weight, adding mirtazapine 15 mg daily.  Chest wall pain 30% better, continue strapping, continue out of work. Increasing tramadol dose. Return in 1 month.  Pulmonary nodules Needs follow-up chest CT in a year, he will do this with his PCP.  I spent 25 minutes with this patient, greater than 50% was face-to-face time counseling regarding the above diagnoses ___________________________________________ Ihor Austinhomas J. Benjamin Stainhekkekandam, [name], [name], [name]. Primary Care and Sports Medicine [facility]  Adjunct Instructor of Family Medicine  [employer]

## 2018-03-03 ENCOUNTER — Encounter: Payer: Self-pay | Admitting: Sports Medicine

## 2018-03-03 ENCOUNTER — Ambulatory Visit (INDEPENDENT_AMBULATORY_CARE_PROVIDER_SITE_OTHER): Payer: BLUE CROSS/BLUE SHIELD | Admitting: Sports Medicine

## 2018-03-03 DIAGNOSIS — F418 Other specified anxiety disorders: Secondary | ICD-10-CM | POA: Diagnosis not present

## 2018-03-03 DIAGNOSIS — F17200 Nicotine dependence, unspecified, uncomplicated: Secondary | ICD-10-CM

## 2018-03-03 DIAGNOSIS — R0789 Other chest pain: Secondary | ICD-10-CM | POA: Diagnosis not present

## 2018-03-03 DIAGNOSIS — R918 Other nonspecific abnormal finding of lung field: Secondary | ICD-10-CM | POA: Diagnosis not present

## 2018-03-03 MED ORDER — VARENICLINE TARTRATE 1 MG PO TABS: 1.0000 mg | ORAL_TABLET | Freq: Two times a day (BID) | ORAL | 3 refills | Status: DC

## 2018-03-03 MED ORDER — MIRTAZAPINE 15 MG PO TABS: 15.0000 mg | ORAL_TABLET | Freq: Every day | ORAL | 3 refills | Status: DC

## 2018-03-03 MED ORDER — VARENICLINE TARTRATE 0.5 MG X 11 & 1 MG X 42 PO MISC: ORAL | 0 refills | Status: DC

## 2018-03-03 NOTE — Progress Notes (Addendum)
  Subjective:    CC: Follow-up  HPI: [name] returns, he is doing extremely well, no complaints.  He does have a new patient appointment with a new PCP in May.  I reviewed the past medical history, family history, social history, surgical history, and allergies today and no changes were needed.  Please see the problem list section below in epic for further details.  Past Medical History: Past Medical History:  Diagnosis Date  . Asthma   . Depression    Past Surgical History: No past surgical history on file. Social History: Social History   Socioeconomic History  . Marital status: Legally Separated    Spouse name: None  . Number of children: 2  . Years of education: None  . Highest education level: None  Social Needs  . Financial resource strain: None  . Food insecurity - worry: None  . Food insecurity - inability: None  . Transportation needs - medical: None  . Transportation needs - non-medical: None  Occupational History  . None  Tobacco Use  . Smoking status: Current Every Day Smoker    Packs/day: 1.00  . Smokeless tobacco: Never Used  Substance and Sexual Activity  . Alcohol use: No  . Drug use: No  . Sexual activity: None  Other Topics Concern  . None  Social History Narrative  . None   Family History: No family history on file. Allergies: Allergies  Allergen Reactions  . Other     "sleeping pills"   Medications: See med rec.  Review of Systems: No fevers, chills, night sweats, weight loss, chest pain, or shortness of breath.   Objective:    General: Well Developed, well nourished, and in no acute distress.  Neuro: Alert and oriented x3, extra-ocular muscles intact, sensation grossly intact.  HEENT: Normocephalic, atraumatic, pupils equal round reactive to light, neck supple, no masses, no lymphadenopathy, thyroid nonpalpable.  Skin: Warm and dry, no rashes. Cardiac: Regular rate and rhythm, no murmurs rubs or gallops, no lower extremity edema.    Respiratory: Clear to auscultation bilaterally. Not using accessory muscles, speaking in full sentences.  Impression and Recommendations:    Depression with anxiety Completely resolved with Remeron 15 mg, good 10 pound weight gain as well. The other thing that may have helped is his divorce is now finalized and he has full custody of his kids. He does have a follow-up and establish care visit with another primary care provider, I am going to give him enough Remeron to last until his new patient appointment in May. He does understand we will not be providing him more refills afterwards.  Chest wall pain Resolved.  Okay to go back to work.  Pulmonary nodules Noted pulmonary nodules, he understands he needs to follow-up with his PCP for a repeat chest CT in 1 year.  Smoker Very motivated to quit, would like to try Chantix, he can follow this up with his PCP, I will provide the first couple of months worth. ___________________________________________ Ihor Austinhomas J. Benjamin Stainhekkekandam, [name], [name], [name]. Primary Care and Sports Medicine [name]  Adjunct Instructor of Family Medicine  [employer]

## 2018-03-03 NOTE — Assessment & Plan Note (Signed)
Very motivated to quit, would like to try Chantix, he can follow this up with his PCP, I will provide the first couple of months worth.

## 2018-03-03 NOTE — Assessment & Plan Note (Addendum)
Completely resolved with Remeron 15 mg, good 10 pound weight gain as well. The other thing that may have helped is his divorce is now finalized and he has full custody of his kids. He does have a follow-up and establish care visit with another primary care provider, I am going to give him enough Remeron to last until his new patient appointment in May. He does understand we will not be providing him more refills afterwards.

## 2018-03-03 NOTE — Assessment & Plan Note (Signed)
Resolved.  Okay to go back to work.

## 2018-03-03 NOTE — Addendum Note (Signed)
Addended by: Monica BectonHEKKEKANDAM, THOMAS J on: 03/03/2018 10:04 AM   Modules accepted: Orders, Level of Service

## 2018-03-03 NOTE — Assessment & Plan Note (Signed)
Noted pulmonary nodules, he understands he needs to follow-up with his PCP for a repeat chest CT in 1 year.

## 2018-03-28 ENCOUNTER — Emergency Department (INDEPENDENT_AMBULATORY_CARE_PROVIDER_SITE_OTHER): Payer: Worker's Compensation

## 2018-03-28 ENCOUNTER — Encounter: Payer: Self-pay | Admitting: *Deleted

## 2018-03-28 ENCOUNTER — Other Ambulatory Visit: Payer: Self-pay

## 2018-03-28 ENCOUNTER — Emergency Department (INDEPENDENT_AMBULATORY_CARE_PROVIDER_SITE_OTHER)
Admission: EM | Admit: 2018-03-28 | Discharge: 2018-03-28 | Disposition: A | Discharge status: Home / Self Care | Payer: Worker's Compensation | Source: Home / Self Care | Attending: Family Medicine | Admitting: Family Medicine

## 2018-03-28 DIAGNOSIS — S0990XA Unspecified injury of head, initial encounter: Secondary | ICD-10-CM

## 2018-03-28 DIAGNOSIS — G44309 Post-traumatic headache, unspecified, not intractable: Secondary | ICD-10-CM

## 2018-03-28 DIAGNOSIS — S0093XA Contusion of unspecified part of head, initial encounter: Secondary | ICD-10-CM

## 2018-03-28 MED ORDER — HYDROCODONE-ACETAMINOPHEN 5-325 MG PO TABS: 1.0000 | ORAL_TABLET | Freq: Four times a day (QID) | ORAL | 0 refills | Status: DC | PRN

## 2018-03-28 NOTE — ED Provider Notes (Signed)
[facility]    CSN: [id number] Arrival date & time: 03/28/18  1518     History   Chief Complaint Chief Complaint  Patient presents with  . Head Injury    HPI [name] is a 42 y.o. male.   Patient reports that a logging chain came loose and hit his right head two days ago.  No loss of consciousness but he has had a severe right side headache since then.  Today he has had photophobia (right eye worse than left) and decreased concentration.  No nausea/vomiting   The history is provided by the patient.  Head Injury  Location:  R temporal Time since incident:  2 days Mechanism of injury: direct blow   Pain details:    Quality:  Aching   Severity:  Severe   Duration:  2 days   Timing:  Constant   Progression:  Unchanged Chronicity:  New Relieved by:  Nothing Worsened by:  Light Ineffective treatments:  NSAIDs Associated symptoms: headache   Associated symptoms: no blurred vision, no disorientation, no double vision, no focal weakness, no hearing loss, no loss of consciousness, no memory loss, no nausea, no neck pain, no numbness, no seizures, no tinnitus and no vomiting     Past Medical History:  Diagnosis Date  . Asthma   . Depression     Patient Active Problem List   Diagnosis Date Noted  . Smoker 03/03/2018  . Depression with anxiety 02/03/2018  . Pulmonary nodules 02/03/2018  . Chest wall pain 01/20/2018  . ASTHMA 05/20/2011    History reviewed. No pertinent surgical history.     Home Medications    Prior to Admission medications   Medication Sig Start Date End Date Taking? Authorizing Provider  HYDROcodone-acetaminophen (NORCO/VICODIN) 5-325 MG tablet Take 1 tablet by mouth every 6 (six) hours as needed for moderate pain. 03/28/18   Lattie HawBeese, Ralph Brouwer A, MD  mirtazapine (REMERON) 15 MG tablet Take 1 tablet (15 mg total) by mouth at bedtime. 03/03/18   Monica Bectonhekkekandam, Thomas J, MD  traMADol (ULTRAM) 50 MG tablet 1 tabs by mouth Q8 hours, maximum  6 tabs per day. 02/03/18   Monica Bectonhekkekandam, Thomas J, MD  varenicline (CHANTIX STARTING MONTH PAK) 0.5 MG X 11 & 1 MG X 42 tablet Take one 0.5mg  tablet by mouth qd for 3 days, then increase to one 0.5mg  tablet bid for 3 days, then increase to one 1mg  tablet bid. 03/03/18   Monica Bectonhekkekandam, Thomas J, MD  varenicline (CHANTIX) 1 MG tablet Take 1 tablet (1 mg total) by mouth 2 (two) times daily. 03/03/18   Monica Bectonhekkekandam, Thomas J, MD    Family History History reviewed. No pertinent family history.  Social History Social History   Tobacco Use  . Smoking status: Current Every Day Smoker    Packs/day: 1.00  . Smokeless tobacco: Never Used  Substance Use Topics  . Alcohol use: No  . Drug use: No     Allergies   Other   Review of Systems Review of Systems  HENT: Negative for hearing loss and tinnitus.   Eyes: Negative for blurred vision and double vision.  Gastrointestinal: Negative for nausea and vomiting.  Musculoskeletal: Negative for neck pain.  Neurological: Positive for headaches. Negative for focal weakness, seizures, loss of consciousness and numbness.  Psychiatric/Behavioral: Negative for memory loss.  All other systems reviewed and are negative.    Physical Exam Triage Vital Signs ED Triage Vitals  Enc Vitals Group     BP  03/28/18 1551 120/82     Pulse Rate 03/28/18 1551 73     Resp 03/28/18 1551 18     Temp 03/28/18 1551 99.1 F (37.3 C)     Temp Source 03/28/18 1551 Oral     SpO2 03/28/18 1551 99 %     Weight 03/28/18 1552 163 lb (73.9 kg)     Height 03/28/18 1552 6\' 2"  (1.88 m)     Head Circumference --      Peak Flow --      Pain Score 03/28/18 1551 8     Pain Loc --      Pain Edu? --      Excl. in GC? --    No data found.  Updated Vital Signs BP 120/82 (BP Location: Right Arm)   Pulse 73   Temp 99.1 F (37.3 C) (Oral)   Resp 18   Ht 6\' 2"  (1.88 m)   Wt 163 lb (73.9 kg)   SpO2 99%   BMI 20.93 kg/m   Visual Acuity Right Eye Distance:   Left Eye  Distance:   Bilateral Distance:    Right Eye Near:   Left Eye Near:    Bilateral Near:     Physical Exam  Constitutional: He is oriented to person, place, and time. He appears well-developed and well-nourished. No distress.  HENT:  Head: Normocephalic. Head is without raccoon's eyes, without Battle's sign, without abrasion, without laceration and without right periorbital erythema.    Right Ear: Tympanic membrane, external ear and ear canal normal.  Left Ear: Tympanic membrane, external ear and ear canal normal.  Nose: Nose normal.  Mouth/Throat: Oropharynx is clear and moist.  Tenderness and mild swelling over right zygomatic arch.  Eyes: Pupils are equal, round, and reactive to light. Conjunctivae and EOM are normal.  Fundi benign.  Mild photophobia present.  Neck: Normal range of motion.  Cardiovascular: Normal heart sounds.  Pulmonary/Chest: Breath sounds normal.  Abdominal: There is no tenderness.  Musculoskeletal: He exhibits no edema.  Neurological: He is alert and oriented to person, place, and time. He displays normal reflexes. No cranial nerve deficit or sensory deficit. He exhibits normal muscle tone. Coordination normal.  Skin: Skin is warm and dry.  Nursing note and vitals reviewed.    UC Treatments / Results  Labs (all labs ordered are listed, but only abnormal results are displayed) Labs Reviewed - No data to display  EKG None Radiology Ct Head Wo Contrast  Result Date: 03/28/2018 CLINICAL DATA:  Pt was hit in the head with a logging chain two days ago and still having headaches and blurred vision. Pt was hit on the right side. EXAM: CT HEAD WITHOUT CONTRAST TECHNIQUE: Contiguous axial images were obtained from the base of the skull through the vertex without intravenous contrast. COMPARISON:  None. FINDINGS: Brain: No evidence of acute infarction, hemorrhage, hydrocephalus, extra-axial collection or mass lesion/mass effect. Vascular: No hyperdense vessel or  unexpected calcification. Skull: No osseous abnormality. Sinuses/Orbits: Visualized paranasal sinuses are clear. Visualized mastoid sinuses are clear. Visualized orbits demonstrate no focal abnormality. Other: None IMPRESSION: No acute intracranial pathology. Electronically Signed   By: Elige Ko   On: 03/28/2018 16:46    Procedures Procedures (including critical care time)  Medications Ordered in UC Medications - No data to display   Initial Impression / Assessment and Plan / UC Course  I have reviewed the triage vital signs and the nursing notes.  Pertinent labs & imaging results that were  available during my care of the patient were reviewed by me and considered in my medical decision making (see chart for details).    RX for Lortab. Controlled Substance Prescriptions I have consulted the  Controlled Substances Registry for this patient, and feel the risk/benefit ratio today is favorable for proceeding with this prescription for a controlled substance.  Apply ice pack for 10 to 15 minutes, 3 to 4 times daily  Continue until pain and swelling decrease.   May take Ibuprofen 200mg , 4 tabs every 8 hours with food.  Discussed head injury precautions. If symptoms become significantly worse during the night or over the weekend, proceed to the local emergency room.   Followup with neurologist if not improving one week. Avoid further head injury.    Final Clinical Impressions(s) / UC Diagnoses   Final diagnoses:  Contusion of head, initial encounter  Injury of head, initial encounter    ED Discharge Orders        Ordered    HYDROcodone-acetaminophen (NORCO/VICODIN) 5-325 MG tablet  Every 6 hours PRN     03/28/18 1712          Lattie Haw, MD 04/05/18 1504

## 2018-03-28 NOTE — ED Triage Notes (Signed)
Pt reports that he was accidentally hit in his RT temple with a log chain 2 days ago. Last dose IBF 12N today.

## 2018-03-28 NOTE — Discharge Instructions (Addendum)
Apply ice pack for 10 to 15 minutes, 3 to 4 times daily  Continue until pain and swelling decrease.   May take Ibuprofen 200mg , 4 tabs every 8 hours with food.  If symptoms become significantly worse during the night or over the weekend, proceed to the local emergency room.   Avoid further head injury.

## 2018-05-04 ENCOUNTER — Encounter: Payer: Self-pay | Admitting: Emergency Medicine

## 2018-05-04 ENCOUNTER — Emergency Department
Admission: EM | Admit: 2018-05-04 | Discharge: 2018-05-04 | Disposition: A | Discharge status: Home / Self Care | Payer: Medicaid Other | Source: Home / Self Care | Attending: Family Medicine | Admitting: Family Medicine

## 2018-05-04 ENCOUNTER — Other Ambulatory Visit: Payer: Self-pay

## 2018-05-04 DIAGNOSIS — L237 Allergic contact dermatitis due to plants, except food: Secondary | ICD-10-CM | POA: Diagnosis not present

## 2018-05-04 MED ORDER — DEXAMETHASONE SODIUM PHOSPHATE 10 MG/ML IJ SOLN
10.0000 mg | Freq: Once | INTRAMUSCULAR | Status: AC
  Administered 2018-05-04: 10 mg via INTRAMUSCULAR

## 2018-05-04 MED ORDER — CETIRIZINE HCL 10 MG PO TABS: 10.0000 mg | ORAL_TABLET | Freq: Every day | ORAL | 0 refills | Status: DC

## 2018-05-04 MED ORDER — PREDNISONE 20 MG PO TABS: ORAL_TABLET | ORAL | 0 refills | Status: DC

## 2018-05-04 NOTE — ED Triage Notes (Signed)
Rash on arms, started yesterday, patient says he's highly allergic to poison ivy

## 2018-05-04 NOTE — ED Provider Notes (Signed)
[name]    CSN: [id number] Arrival date & time: 05/04/18  1535     History   Chief Complaint Chief Complaint  Patient presents with  . Rash    HPI [name] is a 42 y.o. male.   HPI  [name] is a 42 y.o. male presenting to UC with c/o gradually worsening erythematous, pruritic rash to Right and Left forearms.  He noticed a small spot on his Right arm yesterday and 3 more spots on his Left arm today.  Areas are similar to previous reactions to poison ivy.  He did cut the grass recently and wonders if that is where he came in contact.  He has not tried anything for his symptoms yet but states if he waits, he will be covered in the rash. Denies oral swelling or SOB. No new soaps, lotions or medications.   Past Medical History:  Diagnosis Date  . Asthma   . Depression     Patient Active Problem List   Diagnosis Date Noted  . Smoker 03/03/2018  . Depression with anxiety 02/03/2018  . Pulmonary nodules 02/03/2018  . Chest wall pain 01/20/2018  . ASTHMA 05/20/2011    History reviewed. No pertinent surgical history.     Home Medications    Prior to Admission medications   Medication Sig Start Date End Date Taking? Authorizing Provider  cetirizine (ZYRTEC) 10 MG tablet Take 1 tablet (10 mg total) by mouth daily. 05/04/18   Lurene Shadow, PA-C  mirtazapine (REMERON) 15 MG tablet Take 1 tablet (15 mg total) by mouth at bedtime. 03/03/18   Monica Becton, MD  predniSONE (DELTASONE) 20 MG tablet 3 tabs po day one, then 2 po daily x 4 days 05/04/18   Lurene Shadow, PA-C    Family History No family history on file.  Social History Social History   Tobacco Use  . Smoking status: Current Every Day Smoker    Packs/day: 1.00  . Smokeless tobacco: Never Used  Substance Use Topics  . Alcohol use: No  . Drug use: No     Allergies   Other   Review of Systems Review of Systems  Respiratory: Negative for shortness of breath and stridor.    Musculoskeletal: Negative for arthralgias and joint swelling.  Skin: Positive for rash. Negative for wound.     Physical Exam Triage Vital Signs ED Triage Vitals  Enc Vitals Group     BP 05/04/18 1559 127/86     Pulse Rate 05/04/18 1559 77     Resp --      Temp 05/04/18 1559 (!) 97.4 F (36.3 C)     Temp Source 05/04/18 1559 Oral     SpO2 05/04/18 1559 98 %     Weight 05/04/18 1600 168 lb (76.2 kg)     Height 05/04/18 1600  (1.88 m)     Head Circumference --      Peak Flow --      Pain Score 05/04/18 1600 0     Pain Loc --      Pain Edu? --      Excl. in GC? --    No data found.  Updated Vital Signs BP 127/86 (BP Location: Right Arm)   Pulse 77   Temp (!) 97.4 F (36.3 C) (Oral)   Ht  (1.88 m)   Wt 168 lb (76.2 kg)   SpO2 98%   BMI 21.57 kg/m   Visual Acuity  Right Eye Distance:   Left Eye Distance:   Bilateral Distance:    Right Eye Near:   Left Eye Near:    Bilateral Near:     Physical Exam  Constitutional: He is oriented to person, place, and time. He appears well-developed and well-nourished. No distress.  HENT:  Head: Normocephalic and atraumatic.  Mouth/Throat: Oropharynx is clear and moist.  Eyes: EOM are normal.  Neck: Normal range of motion.  Cardiovascular: Normal rate.  Pulmonary/Chest: Effort normal. No respiratory distress.  Musculoskeletal: Normal range of motion.  Neurological: He is alert and oriented to person, place, and time.  Skin: Skin is warm and dry. He is not diaphoretic. There is erythema.  Right forearm: 2mm erythematous vesicle, centralized scab.  Left forearm: 4 grouped erythematous vesicles, in tact. Non-tender. No red streaking.  Psychiatric: He has a normal mood and affect. His behavior is normal.  Nursing note and vitals reviewed.    UC Treatments / Results  Labs (all labs ordered are listed, but only abnormal results are displayed) Labs Reviewed - No data to display  EKG None  Radiology No results  found.  Procedures Procedures (including critical care time)  Medications Ordered in UC Medications  dexamethasone (DECADRON) injection 10 mg (10 mg Intramuscular Given 05/04/18 1619)    Initial Impression / Assessment and Plan / UC Course  I have reviewed the triage vital signs and the nursing notes.  Pertinent labs & imaging results that were available during my care of the patient were reviewed by me and considered in my medical decision making (see chart for details).     Hx and exam c/w contact dermatitis. Medical records reviewed. Home care instructions provided below.    Final Clinical Impressions(s) / UC Diagnoses   Final diagnoses:  Allergic contact dermatitis due to plants, except food     Discharge Instructions      You were given a shot of decadron (a steroid) today to help with itching and swelling from a likely allergic reaction.  You have been prescribed 5 days of prednisone, an oral steroid.  You may start this medication tomorrow with breakfast.    Because you frequently get rashes from cutting the grass, it is recommended you speak with your family doctor about possibly having several refills of prednisone in case you get it shortly after this episode has cleared.      ED Prescriptions    Medication Sig Dispense Auth. Provider   predniSONE (DELTASONE) 20 MG tablet 3 tabs po day one, then 2 po daily x 4 days 11 tablet Jensen Kilburg O, PA-C   cetirizine (ZYRTEC) 10 MG tablet Take 1 tablet (10 mg total) by mouth daily. 30 tablet Lurene Shadow, PA-C     Controlled Substance Prescriptions [place] Controlled Substance Registry consulted? Not Applicable   Rolla Plate 05/04/18 1701

## 2018-05-04 NOTE — Discharge Instructions (Signed)
°  You were given a shot of decadron (a steroid) today to help with itching and swelling from a likely allergic reaction.  You have been prescribed 5 days of prednisone, an oral steroid.  You may start this medication tomorrow with breakfast.    Because you frequently get rashes from cutting the grass, it is recommended you speak with your family doctor about possibly having several refills of prednisone in case you get it shortly after this episode has cleared.

## 2018-05-12 ENCOUNTER — Emergency Department
Admission: EM | Admit: 2018-05-12 | Discharge: 2018-05-12 | Discharge status: Against Medical Advice | Payer: Medicaid Other | Source: Home / Self Care

## 2018-05-12 NOTE — ED Triage Notes (Signed)
Pt states he broke his foot on 5/3. Was recommended to cast it, but pt refused. Also refused boot due to not wanting to let new job he has a broken foot. Pain level 10.

## 2019-03-27 ENCOUNTER — Other Ambulatory Visit: Payer: Self-pay

## 2019-03-27 ENCOUNTER — Emergency Department (INDEPENDENT_AMBULATORY_CARE_PROVIDER_SITE_OTHER): Admission: EM | Admit: 2019-03-27 | Discharge: 2019-03-27 | Payer: Medicaid Other | Source: Home / Self Care

## 2019-03-27 DIAGNOSIS — R55 Syncope and collapse: Secondary | ICD-10-CM | POA: Insufficient documentation

## 2019-03-27 DIAGNOSIS — R0789 Other chest pain: Secondary | ICD-10-CM | POA: Diagnosis not present

## 2019-03-27 DIAGNOSIS — R001 Bradycardia, unspecified: Secondary | ICD-10-CM

## 2019-03-27 LAB — POCT FASTING CBG KUC MANUAL ENTRY: POCT Glucose (KUC): 102 mg/dL — AB (ref 70–99)

## 2019-03-27 MED ORDER — GENERIC EXTERNAL MEDICATION
Status: DC
Start: ? — End: 2019-03-27

## 2019-03-27 MED ORDER — SODIUM CHLORIDE 0.9 % IV SOLN
10.00 | INTRAVENOUS | Status: DC
Start: ? — End: 2019-03-27

## 2019-03-27 NOTE — ED Provider Notes (Signed)
[name]    CSN: [id number] Arrival date & time: 03/27/19  1347     History   Chief Complaint Chief Complaint  Patient presents with  . Chest Pain    past syncope episodes    HPI [name] is a 43 y.o. male.   HPI [name] is a 43 y.o. male presenting to UC with c/o constant centralized chest pain that started around 7AM, slightly worse on the Left side. Pain is aching and sore but sharp with deep inspiration. He reports associated dizziness, fatigue, and 2 episodes of syncope and collapse on Saturday, 3/29. He believes he may have been dehydrated all month but symptoms did not start until this weekend. Denies cough or congestion. No new medications or OTC supplements. He does drink up to 4 energy drinks a day but has done so for years and has even cut back over time.  No hx of CAD or family hx of heart problems. Denies hx of blood clots. No fever, chills, n/v/d.   Past Medical History:  Diagnosis Date  . Asthma   . Depression     Patient Active Problem List   Diagnosis Date Noted  . Smoker 03/03/2018  . Depression with anxiety 02/03/2018  . Pulmonary nodules 02/03/2018  . Chest wall pain 01/20/2018  . ASTHMA 05/20/2011    History reviewed. No pertinent surgical history.     Home Medications    Prior to Admission medications   Medication Sig Start Date End Date Taking? Authorizing Provider  cetirizine (ZYRTEC) 10 MG tablet Take 1 tablet (10 mg total) by mouth daily. 05/04/18   Lurene Shadow, PA-C  HYDROcodone-acetaminophen (NORCO) 10-325 MG tablet Take 1 tablet by mouth every 6 (six) hours as needed.    [provider]  mirtazapine (REMERON) 15 MG tablet Take 1 tablet (15 mg total) by mouth at bedtime. 03/03/18   Monica Becton, MD  predniSONE (DELTASONE) 20 MG tablet 3 tabs po day one, then 2 po daily x 4 days 05/04/18   Lurene Shadow, PA-C  traMADol-acetaminophen (ULTRACET) 37.5-325 MG tablet Take 1 tablet by mouth every 6  (six) hours as needed.    [provider]    Family History History reviewed. No pertinent family history.  Social History Social History   Tobacco Use  . Smoking status: Current Every Day Smoker    Packs/day: 1.00  . Smokeless tobacco: Never Used  Substance Use Topics  . Alcohol use: No  . Drug use: No     Allergies   Other   Review of Systems Review of Systems  Constitutional: Positive for fatigue. Negative for chills and fever.  HENT: Negative for congestion, ear pain, sore throat, trouble swallowing and voice change.   Respiratory: Positive for chest tightness. Negative for cough and shortness of breath.   Cardiovascular: Positive for chest pain. Negative for palpitations and leg swelling.  Gastrointestinal: Negative for abdominal pain, diarrhea, nausea and vomiting.  Musculoskeletal: Negative for arthralgias, back pain and myalgias.  Skin: Negative for rash.  Neurological: Positive for dizziness, syncope, weakness and light-headedness.  All other systems reviewed and are negative.    Physical Exam Triage Vital Signs ED Triage Vitals  Enc Vitals Group     BP 03/27/19 1413 122/85     Pulse Rate 03/27/19 1413 64     Resp 03/27/19 1413 18     Temp 03/27/19 1413 97.7 F (36.5 C)     Temp Source 03/27/19 1413  Oral     SpO2 03/27/19 1413 100 %     Weight --      Height --      Head Circumference --      Peak Flow --      Pain Score 03/27/19 1414 5     Pain Loc --      Pain Edu? --      Excl. in GC? --    Orthostatic VS for the past 24 hrs:  BP- Lying Pulse- Lying BP- Sitting Pulse- Sitting BP- Standing at 0 minutes Pulse- Standing at 0 minutes  03/27/19 1416 107/72 54 112/73 71 123/83 68    Updated Vital Signs BP 122/85 (BP Location: Right Arm)   Pulse 64   Temp 97.7 F (36.5 C) (Oral)   Resp 18   SpO2 100%   Visual Acuity Right Eye Distance:   Left Eye Distance:   Bilateral Distance:    Right Eye Near:   Left Eye Near:    Bilateral  Near:     Physical Exam Vitals signs and nursing note reviewed.  Constitutional:      Appearance: He is well-developed.  HENT:     Head: Normocephalic and atraumatic.  Eyes:     Extraocular Movements: Extraocular movements intact.     Pupils: Pupils are equal, round, and reactive to light.  Neck:     Musculoskeletal: Normal range of motion.  Cardiovascular:     Rate and Rhythm: Regular rhythm. Bradycardia present.  Pulmonary:     Effort: Pulmonary effort is normal.     Breath sounds: Normal breath sounds.  Musculoskeletal: Normal range of motion.  Skin:    General: Skin is warm and dry.     Capillary Refill: Capillary refill takes less than 2 seconds.  Neurological:     General: No focal deficit present.     Mental Status: He is alert and oriented to person, place, and time.  Psychiatric:        Behavior: Behavior normal.      UC Treatments / Results  Labs (all labs ordered are listed, but only abnormal results are displayed) Labs Reviewed  POCT FASTING CBG [facility] MANUAL ENTRY - Abnormal; Notable for the following components:      Result Value   POCT Glucose ([facility]) 102 (*)    All other components within normal limits    EKG Date/Time:03/27/2019    14:02:27 Ventricular Rate: 55 PR Interval: 152 QRS Duration: 88 QT Interval: 406 QTC Calculation: 388 P-R-T axes:  57   74   62 Text Interpretation: Sinus bradycardia, otherwise normal EKG No prior EKGs to compare.    Radiology No results found.  Procedures Procedures (including critical care time)  Medications Ordered in UC Medications - No data to display  Initial Impression / Assessment and Plan / UC Course  I have reviewed the triage vital signs and the nursing notes.  Pertinent labs & imaging results that were available during my care of the patient were reviewed by me and considered in my medical decision making (see chart for details).    Hx and exam concerning for symptomatic bradycardia CBG:  102 Consulted with Dr. Cathren Harsh, agrees pt should be evaluated further in emergency department Pt declined EMS due to cost concerns Pt's mother arrived to drive pt to emergency department for further evaluation and treatment.   Final Clinical Impressions(s) / UC Diagnoses   Final diagnoses:  Other chest pain  Syncope and collapse  Symptomatic bradycardia  Discharge Instructions      You have refused EMS transport.   Please have your family member drive you directly to the emergency department for further evaluation and treatment of your symptoms.     ED Prescriptions    None     Controlled Substance Prescriptions [place] Controlled Substance Registry consulted? Not Applicable   Rolla Platehelps, Alea Ryer O, PA-C 03/27/19 1458

## 2019-03-27 NOTE — ED Triage Notes (Signed)
Pt c/o chest pain that just started today. Also some dizziness with exertion. Stated he had 2 syncope episodes on Saturday.

## 2019-03-27 NOTE — Discharge Instructions (Signed)
°  You have refused EMS transport.   Please have your family member drive you directly to the emergency department for further evaluation and treatment of your symptoms.

## 2019-03-29 ENCOUNTER — Telehealth: Payer: Self-pay

## 2019-03-29 MED ORDER — GENERIC EXTERNAL MEDICATION
Status: DC
Start: ? — End: 2019-03-29

## 2019-03-29 NOTE — Telephone Encounter (Signed)
Pt stated that he is feeling better.  He is following up with neurology.  Will follow up as needed.

## 2019-04-19 ENCOUNTER — Emergency Department (INDEPENDENT_AMBULATORY_CARE_PROVIDER_SITE_OTHER)
Admission: EM | Admit: 2019-04-19 | Discharge: 2019-04-19 | Disposition: A | Discharge status: Home / Self Care | Payer: Medicaid Other | Source: Home / Self Care

## 2019-04-19 ENCOUNTER — Other Ambulatory Visit: Payer: Self-pay

## 2019-04-19 DIAGNOSIS — S46811A Strain of other muscles, fascia and tendons at shoulder and upper arm level, right arm, initial encounter: Secondary | ICD-10-CM | POA: Diagnosis not present

## 2019-04-19 DIAGNOSIS — M546 Pain in thoracic spine: Secondary | ICD-10-CM

## 2019-04-19 DIAGNOSIS — W19XXXA Unspecified fall, initial encounter: Secondary | ICD-10-CM

## 2019-04-19 MED ORDER — METHOCARBAMOL 500 MG PO TABS: ORAL_TABLET | ORAL | 0 refills | Status: DC

## 2019-04-19 MED ORDER — DICLOFENAC SODIUM 75 MG PO TBEC: 75.0000 mg | DELAYED_RELEASE_TABLET | Freq: Two times a day (BID) | ORAL | 0 refills | Status: DC

## 2019-04-19 MED ORDER — IBUPROFEN 600 MG PO TABS
600.0000 mg | ORAL_TABLET | Freq: Once | ORAL | Status: AC
  Administered 2019-04-19: 600 mg via ORAL

## 2019-04-19 NOTE — ED Provider Notes (Signed)
[name]    CSN: [id number] Arrival date & time: 04/19/19  1507     History   Chief Complaint Chief Complaint  Patient presents with  . Shoulder Pain    RT    HPI SOU AHMAD is a 43 y.o. male.   HPI A couple of weeks ago the patient was doing yard work and tripped backwards over the basketball goal support and fell landing on his right scapula.  He shook it off and it really did not seem to bother him other than just being a little bit sore.  A couple of days ago he reached above his head to lift down a heavy bag of at [employer], 1 of the big bags, and had sudden acute severe pain in the right scapular area.  This is persisted.  He has never had a pain like that in his back.  He works Tax adviser for [employer].  As long as he works with his lifting from the elbow he is okay but when he tries to lift up his shoulder it hurts terribly.  No other major concerns.  Not on any regular medicines.  He took a couple of Advil this morning. Past Medical History:  Diagnosis Date  . Asthma   . Depression     Patient Active Problem List   Diagnosis Date Noted  . Smoker 03/03/2018  . Depression with anxiety 02/03/2018  . Pulmonary nodules 02/03/2018  . Chest wall pain 01/20/2018  . ASTHMA 05/20/2011    History reviewed. No pertinent surgical history.     Home Medications    Prior to Admission medications   Medication Sig Start Date End Date Taking? Authorizing Provider  cetirizine (ZYRTEC) 10 MG tablet Take 1 tablet (10 mg total) by mouth daily. 05/04/18   Lurene Shadow, PA-C  HYDROcodone-acetaminophen (NORCO) 10-325 MG tablet Take 1 tablet by mouth every 6 (six) hours as needed.    [provider]  mirtazapine (REMERON) 15 MG tablet Take 1 tablet (15 mg total) by mouth at bedtime. 03/03/18   Monica Becton, MD  predniSONE (DELTASONE) 20 MG tablet 3 tabs po day one, then 2 po daily x 4 days 05/04/18   Lurene Shadow, PA-C   traMADol-acetaminophen (ULTRACET) 37.5-325 MG tablet Take 1 tablet by mouth every 6 (six) hours as needed.    [provider]    Family History History reviewed. No pertinent family history.  Social History Social History   Tobacco Use  . Smoking status: Current Every Day Smoker    Packs/day: 1.00  . Smokeless tobacco: Never Used  Substance Use Topics  . Alcohol use: No  . Drug use: No     Allergies   Other   Review of Systems Review of Systems No other major complaints Physical Exam Triage Vital Signs ED Triage Vitals [04/19/19 1527]  Enc Vitals Group     BP 123/84     Pulse Rate 78     Resp 18     Temp 97.7 F (36.5 C)     Temp Source Oral     SpO2 100 %     Weight      Height      Head Circumference      Peak Flow      Pain Score 9     Pain Loc      Pain Edu?      Excl. in GC?    No data found.  Updated Vital Signs BP 123/84 (BP Location: Left Arm)   Pulse 78   Temp 97.7 F (36.5 C) (Oral)   Resp 18   SpO2 100%   Visual Acuity Right Eye Distance:   Left Eye Distance:   Bilateral Distance:    Right Eye Near:   Left Eye Near:    Bilateral Near:     Physical Exam Healthy-appearing young man, pleasant, alert, and oriented.  He was very cautious with the use of his right arm because he said his back hurting.  However, as I was examining him and he relaxed some, he was able to move the right arm.  He was able to abduct it but that caused pain.  When he raised his hand in front of him it did not hurt as much.  He was able to reach fully upward with pain but had good range of motion.  Able to do anterior and posterior reaching with his hand.  Strength is good.  He is tender under the medial aspect of the right scapula.  Spine itself is nontender.  Chest clear.  UC Treatments / Results  Labs (all labs ordered are listed, but only abnormal results are displayed) Labs Reviewed - No data to display  EKG None  Radiology No results found.   Procedures Procedures (including critical care time) None Medications Ordered in UC Medications  ibuprofen (ADVIL) tablet 600 mg (600 mg Oral Given 04/19/19 1528)    Initial Impression / Assessment and Plan / UC Course  I have reviewed the triage vital signs and the nursing notes.  Pertinent labs & imaging results that were available during my care of the patient were reviewed by me and considered in my medical decision making (see chart for details).     It is my impression that this is a strain of his subscapularis muscles and back.  Will treat accordingly.  No scans or x-rays are indicated at this time. Final Clinical Impressions(s) / UC Diagnoses   Final diagnoses:  Strain of right subscapularis muscle, initial encounter  Acute right-sided thoracic back pain  Fall, initial encounter     Discharge Instructions     Use ice or heat, or alternate the 2 about 15 minutes each 3 - 4 times a day on the right scapular area of the back.  Do gentle stretching exercises  Take the diclofenac 75 mg 1 twice daily at breakfast and supper for pain and inflammation (when on this you are not to take Advil or Aleve which are ibuprofen or naproxen)  Take Tylenol (acetaminophen) 500 mg 2 pills 3 times daily also for pain as needed.  Take the methocarbamol muscle relaxant (Robaxin) 1 or 2 pills in the morning, 1 to 2 pills in the afternoon, and 2 at bedtime for muscle relaxant.  Return if not improving      ED Prescriptions    None     Controlled Substance Prescriptions [place] Controlled Substance Registry consulted? No   Peyton NajjarHopper, Seanne Chirico H, MD 04/19/19 1622

## 2019-04-19 NOTE — ED Triage Notes (Signed)
Pt c/o RT shoulder (blade) pain x 3 days. Last week fell while leaf blowing. Says he tripped and fell backward over a basketball goal base, landing right where his pain is. 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 Pain 9/10. Taking ibuprofen prn.

## 2019-04-19 NOTE — Discharge Instructions (Addendum)
Use ice or heat, or alternate the 2 about 15 minutes each 3 - 4 times a day on the right scapular area of the back.  Do gentle stretching exercises  Take the diclofenac 75 mg 1 twice daily at breakfast and supper for pain and inflammation (when on this you are not to take Advil or Aleve which are ibuprofen or naproxen)  Take Tylenol (acetaminophen) 500 mg 2 pills 3 times daily also for pain as needed.  Take the methocarbamol muscle relaxant (Robaxin) 1 or 2 pills in the morning, 1 to 2 pills in the afternoon, and 2 at bedtime for muscle relaxant.  Return if not improving

## 2019-06-04 ENCOUNTER — Other Ambulatory Visit: Payer: Self-pay

## 2019-06-04 ENCOUNTER — Emergency Department
Admission: EM | Admit: 2019-06-04 | Discharge: 2019-06-04 | Discharge status: Absent without leave | Payer: Medicaid Other | Source: Home / Self Care

## 2019-09-27 DIAGNOSIS — F1190 Opioid use, unspecified, uncomplicated: Secondary | ICD-10-CM | POA: Insufficient documentation

## 2019-12-04 DIAGNOSIS — F1120 Opioid dependence, uncomplicated: Secondary | ICD-10-CM | POA: Insufficient documentation

## 2019-12-04 DIAGNOSIS — M542 Cervicalgia: Secondary | ICD-10-CM | POA: Insufficient documentation

## 2020-02-05 DIAGNOSIS — B3783 Candidal cheilitis: Secondary | ICD-10-CM | POA: Insufficient documentation

## 2020-02-05 DIAGNOSIS — B370 Candidal stomatitis: Secondary | ICD-10-CM | POA: Insufficient documentation

## 2020-04-02 DIAGNOSIS — J4520 Mild intermittent asthma, uncomplicated: Secondary | ICD-10-CM | POA: Insufficient documentation

## 2020-05-12 ENCOUNTER — Other Ambulatory Visit: Payer: Self-pay

## 2020-05-12 ENCOUNTER — Emergency Department (INDEPENDENT_AMBULATORY_CARE_PROVIDER_SITE_OTHER): Payer: Medicaid Other

## 2020-05-12 ENCOUNTER — Emergency Department
Admission: EM | Admit: 2020-05-12 | Discharge: 2020-05-12 | Disposition: A | Discharge status: Home / Self Care | Payer: Medicaid Other | Source: Home / Self Care | Attending: Family Medicine | Admitting: Family Medicine

## 2020-05-12 DIAGNOSIS — R202 Paresthesia of skin: Secondary | ICD-10-CM

## 2020-05-12 DIAGNOSIS — M79621 Pain in right upper arm: Secondary | ICD-10-CM | POA: Diagnosis not present

## 2020-05-12 DIAGNOSIS — M869 Osteomyelitis, unspecified: Secondary | ICD-10-CM | POA: Diagnosis not present

## 2020-05-12 DIAGNOSIS — G8929 Other chronic pain: Secondary | ICD-10-CM

## 2020-05-12 DIAGNOSIS — R1030 Lower abdominal pain, unspecified: Secondary | ICD-10-CM | POA: Diagnosis not present

## 2020-05-12 NOTE — Discharge Instructions (Addendum)
May take Ibuprofen 800mg  every 8 hours with food.

## 2020-05-12 NOTE — ED Provider Notes (Signed)
[facility]    CSN: [id number] Arrival date & time: 05/12/20  1628      History   Chief Complaint Chief Complaint  Patient presents with  . Arm Pain    HPI [name] is a 44 y.o. male.   Patient presents with two complaints: 1)  Three days ago patient suddenly developed an intermittent shooting pain down his right arm to his hand.  The pain occurs less frequently now, but has residual numbness over his right triceps area.  He denies neck pain and loss of strength in his right arm.  He denies right shoulder pain.  He recalls no recent injury. 2)  Patient fell down stairs in 2009, and since then has had chronic pain in his right hip only with external rotation.  The history is provided by the patient.    Past Medical History:  Diagnosis Date  . Asthma   . Depression     Patient Active Problem List   Diagnosis Date Noted  . Smoker 03/03/2018  . Depression with anxiety 02/03/2018  . Pulmonary nodules 02/03/2018  . Chest wall pain 01/20/2018  . ASTHMA 05/20/2011    History reviewed. No pertinent surgical history.     Home Medications    Prior to Admission medications   Medication Sig Start Date End Date Taking? Authorizing Provider  gabapentin (NEURONTIN) 600 MG tablet Take 600 mg by mouth as needed.   Yes [provider]  cetirizine (ZYRTEC) 10 MG tablet Take 1 tablet (10 mg total) by mouth daily. 05/04/18   Noe Gens, PA-C  diclofenac (VOLTAREN) 75 MG EC tablet Take 1 tablet (75 mg total) by mouth 2 (two) times daily. 04/19/19   Posey Boyer, MD  HYDROcodone-acetaminophen (NORCO) 10-325 MG tablet Take 1 tablet by mouth every 6 (six) hours as needed.    [provider]  methocarbamol (ROBAXIN) 500 MG tablet Take 1 or 2 pills in the morning, 1 or 2 in the afternoon, and 2 at bedtime for muscle relaxant 04/19/19   Posey Boyer, MD  mirtazapine (REMERON) 15 MG tablet Take 1 tablet (15 mg total) by mouth at bedtime. 03/03/18    Silverio Decamp, MD  predniSONE (DELTASONE) 20 MG tablet 3 tabs po day one, then 2 po daily x 4 days 05/04/18   Noe Gens, PA-C  traMADol-acetaminophen (ULTRACET) 37.5-325 MG tablet Take 1 tablet by mouth every 6 (six) hours as needed.    [provider]    Family History Family History  Problem Relation Age of Onset  . Healthy Mother   . Healthy Father     Social History Social History   Tobacco Use  . Smoking status: Current Every Day Smoker    Packs/day: 1.00  . Smokeless tobacco: Never Used  Substance Use Topics  . Alcohol use: No  . Drug use: No     Allergies   Other   Review of Systems Review of Systems  Constitutional: Negative.   HENT: Negative.   Eyes: Negative.   Respiratory: Negative.   Cardiovascular: Negative.   Genitourinary: Negative.   Musculoskeletal:       Right hip pain  Skin: Negative.   Neurological:       Paresthesias right arm     Physical Exam Triage Vital Signs ED Triage Vitals  Enc Vitals Group     BP 05/12/20 1643 134/85     Pulse Rate 05/12/20 1643 79     Resp 05/12/20  1643 17     Temp 05/12/20 1643 98 F (36.7 C)     Temp Source 05/12/20 1643 Oral     SpO2 05/12/20 1643 100 %     Weight --      Height --      Head Circumference --      Peak Flow --      Pain Score 05/12/20 1640 0     Pain Loc --      Pain Edu? --      Excl. in GC? --    No data found.  Updated Vital Signs BP 134/85 (BP Location: Left Arm)   Pulse 79   Temp 98 F (36.7 C) (Oral)   Resp 17   SpO2 100%   Visual Acuity Right Eye Distance:   Left Eye Distance:   Bilateral Distance:    Right Eye Near:   Left Eye Near:    Bilateral Near:     Physical Exam Vitals and nursing note reviewed.  Constitutional:      General: He is not in acute distress. HENT:     Head: Normocephalic.     Right Ear: External ear normal.     Left Ear: External ear normal.     Nose: Nose normal.     Mouth/Throat:     Pharynx: Oropharynx is  clear.  Eyes:     Pupils: Pupils are equal, round, and reactive to light.  Cardiovascular:     Rate and Rhythm: Normal rate.     Heart sounds: Normal heart sounds.  Pulmonary:     Breath sounds: Normal breath sounds.  Abdominal:     Tenderness: There is no abdominal tenderness.  Musculoskeletal:        General: No tenderness.       Arms:     Cervical back: Normal range of motion. No tenderness.       Legs:     Comments: Right upper has an area of decreased sensation over the triceps muscle area.  Distal neurovascular function is intact.  No loss of strength.  His symptoms are not exacerbated by neck movement.  Patient has distinct tenderness over symphysis pubis on the right.  Palpation there during resisted lateral adduction, and flexion of his right hip, recreates his hip pain.  Right hip has full range of motion.   Lymphadenopathy:     Cervical: No cervical adenopathy.  Skin:    General: Skin is warm and dry.     Findings: No rash.  Neurological:     Mental Status: He is alert.      UC Treatments / Results  Labs (all labs ordered are listed, but only abnormal results are displayed) Labs Reviewed - No data to display  EKG   Radiology DG Cervical Spine Complete  Result Date: 05/12/2020 CLINICAL DATA:  Intermittent paresthesias right upper extremity for 3 days. EXAM: CERVICAL SPINE - COMPLETE 4+ VIEW COMPARISON:  None. FINDINGS: Visualization through C7 on lateral view. Preservation of the vertebral body and intervertebral disc space heights. C5-6 anterior endplate osteophytosis. No evidence for acute fracture or dislocation. Lateral masses articulate appropriately with the dens. Upper lungs are unremarkable. IMPRESSION: Mild degenerative disc changes. Electronically Signed   By: Annia Belt [name]   On: 05/12/2020 17:55   DG Pelvis 1-2 Views  Result Date: 05/12/2020 CLINICAL DATA:  Persistent pain over symphysis pubis since falling down stairs 2009. Pain with resisted  adduction right hip EXAM: PELVIS - 1-2 VIEW COMPARISON:  None. FINDINGS: The cortical margins of the bony pelvis are intact. No acute or healed fracture. Pubic symphysis and sacroiliac joints are congruent. No significant arthropathy. Both femoral heads are well-seated in the respective acetabula. IMPRESSION: Negative radiograph of the pelvis. Electronically Signed   By: Narda Rutherford [name]   On: 05/12/2020 18:06    Procedures Procedures (including critical care time)  Medications Ordered in UC Medications - No data to display  Initial Impression / Assessment and Plan / UC Course  I have reviewed the triage vital signs and the nursing notes.  Pertinent labs & imaging results that were available during my care of the patient were reviewed by me and considered in my medical decision making (see chart for details).    Patient's right arm paresthesias do not appear to be a result of cervical radiculopathy. Schedule evaluation by Mindi Slicker and [facility].   Final Clinical Impressions(s) / UC Diagnoses   Final diagnoses:  Osteitis of symphysis pubis (HCC)  Paresthesia of right arm     Discharge Instructions     May take Ibuprofen 800mg  every 8 hours with food.     ED Prescriptions    None        , MD 05/14/20 1535

## 2020-05-12 NOTE — ED Triage Notes (Signed)
Patient presents to Urgent Care with complaints of right arm pain from the shoulder down to his fingers since 4 days ago when he work up. Patient reports the pain has gone away (comes rarely now) but now the back of his arm from his elbow up to his shoulder is numb.

## 2020-09-27 ENCOUNTER — Encounter: Payer: Self-pay | Admitting: Emergency Medicine

## 2020-09-27 ENCOUNTER — Other Ambulatory Visit: Payer: Self-pay

## 2020-09-27 ENCOUNTER — Emergency Department (INDEPENDENT_AMBULATORY_CARE_PROVIDER_SITE_OTHER)
Admission: EM | Admit: 2020-09-27 | Discharge: 2020-09-27 | Disposition: A | Discharge status: Home / Self Care | Payer: Medicaid Other | Source: Home / Self Care

## 2020-09-27 DIAGNOSIS — R519 Headache, unspecified: Secondary | ICD-10-CM | POA: Diagnosis not present

## 2020-09-27 DIAGNOSIS — J029 Acute pharyngitis, unspecified: Secondary | ICD-10-CM | POA: Diagnosis not present

## 2020-09-27 DIAGNOSIS — Z79899 Other long term (current) drug therapy: Secondary | ICD-10-CM | POA: Insufficient documentation

## 2020-09-27 DIAGNOSIS — F909 Attention-deficit hyperactivity disorder, unspecified type: Secondary | ICD-10-CM | POA: Insufficient documentation

## 2020-09-27 LAB — POCT CBC W AUTO DIFF (K'VILLE URGENT CARE)

## 2020-09-27 LAB — POCT URINALYSIS DIP (MANUAL ENTRY)
Bilirubin, UA: NEGATIVE
Glucose, UA: NEGATIVE mg/dL
Ketones, POC UA: NEGATIVE mg/dL
Leukocytes, UA: NEGATIVE
Nitrite, UA: NEGATIVE
Protein Ur, POC: NEGATIVE mg/dL
Spec Grav, UA: 1.02 (ref 1.010–1.025)
Urobilinogen, UA: 0.2 E.U./dL
pH, UA: 7 (ref 5.0–8.0)

## 2020-09-27 LAB — POCT RAPID STREP A (OFFICE): Rapid Strep A Screen: NEGATIVE

## 2020-09-27 LAB — POCT MONO SCREEN (KUC): Mono, POC: NEGATIVE

## 2020-09-27 NOTE — ED Provider Notes (Signed)
[name]    CSN: [id number] Arrival date & time: 09/27/20  0834      History   Chief Complaint Chief Complaint  Patient presents with  . Migraine    HPI [name] is a 44 y.o. male.   HPI [name] is a 44 y.o. male presenting to UC with c/o intermittent frontal headache for about 16 days.  Pt was seen on 09/11/20 for c/o dizziness, cough, sore throat, and nausea. He was given antivert, decadron and zofran. States the dizziness resolved in about 2 days but HA started shortly after that.  Pain is aching and sore, 4/10 today but was about 8/10 yesterday.  Denies change in vision, dizziness or nausea today. He has mild photophobia yesterday. No hx of migraines but states he drinks 4-5 Monster energy drinks daily along with sweet tea and Dr. Reino Kent. Pt states he does not drink water and has been hospitalized in the past for drinking the energy drinks.  He is able to fall asleep around 9PM each night but wakes 4-5 times a night before having to get up at 6AM.  He has tried cutting back on the energy drinks in the past, but that caused severe headaches. He has not tried to cut back recently, no recent diet changes.  Denies fever or chills. No ear pain or tinnitus.  No medication taken PTA.   Past Medical History:  Diagnosis Date  . Asthma   . Depression     Patient Active Problem List   Diagnosis Date Noted  . ADHD (attention deficit hyperactivity disorder) 09/27/2020  . Encounter for long-term (current) use of other medications 09/27/2020  . Mild intermittent asthma 04/02/2020  . Angular cheilitis with candidiasis 02/05/2020  . Neck pain 12/04/2019  . 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 Opioid use disorder 09/27/2019  . Syncope 03/27/2019  . Smoker 03/03/2018  . Depression with anxiety 02/03/2018  . Pulmonary nodules 02/03/2018  . Chest wall pain 01/20/2018  . Anxiety 08/27/2011  . Decreased appetite 08/27/2011  . Insomnia  08/27/2011  . Rash 08/27/2011  . Restless leg 08/27/2011  . Gastroenteritis, non-infectious 06/08/2011  . ASTHMA 05/20/2011  . Unspecified asthma, uncomplicated 05/20/2011    History reviewed. No pertinent surgical history.     Home Medications    Prior to Admission medications   Medication Sig Start Date End Date Taking? Authorizing Provider  albuterol (VENTOLIN HFA) 108 (90 Base) MCG/ACT inhaler Inhale into the lungs. 04/02/20  Yes [provider]  gabapentin (NEURONTIN) 600 MG tablet Take 600 mg by mouth as needed.   Yes [provider]  ibuprofen (ADVIL) 800 MG tablet Take 800 mg by mouth 3 (three) times daily. 09/08/20  Yes [provider]  cetirizine (ZYRTEC) 10 MG tablet Take 1 tablet (10 mg total) by mouth daily. 05/04/18   Lurene Shadow, PA-C  diclofenac (VOLTAREN) 75 MG EC tablet Take 1 tablet (75 mg total) by mouth 2 (two) times daily. 04/19/19   Peyton Najjar, MD  HYDROcodone-acetaminophen (NORCO) 10-325 MG tablet Take 1 tablet by mouth every 6 (six) hours as needed.    [provider]  methocarbamol (ROBAXIN) 500 MG tablet Take 1 or 2 pills in the morning, 1 or 2 in the afternoon, and 2 at bedtime for muscle relaxant 04/19/19   Peyton Najjar, MD  mirtazapine (REMERON) 15 MG tablet Take 1 tablet (15 mg total) by mouth at bedtime. 03/03/18   Rodney Langton  J, MD  predniSONE (DELTASONE) 20 MG tablet 3 tabs po day one, then 2 po daily x 4 days 05/04/18   Lurene Shadow, PA-C  traMADol-acetaminophen (ULTRACET) 37.5-325 MG tablet Take 1 tablet by mouth every 6 (six) hours as needed.    [provider]    Family History Family History  Problem Relation Age of Onset  . Healthy Mother   . Healthy Father     Social History Social History   Tobacco Use  . Smoking status: Current Every Day Smoker    Packs/day: 1.00    Years: 28.00    Pack years: 28.00    Types: Cigarettes  . Smokeless tobacco: Never Used  Vaping Use  .  Vaping Use: Never used  Substance Use Topics  . Alcohol use: No  . Drug use: No     Allergies   Other   Review of Systems Review of Systems  Constitutional: Negative for chills and fever.  HENT: Positive for sore throat. Negative for congestion, ear pain, sinus pressure, sinus pain, tinnitus, trouble swallowing and voice change.   Eyes: Negative for photophobia and visual disturbance.  Respiratory: Negative for shortness of breath.   Cardiovascular: Negative for chest pain.  Gastrointestinal: Negative for nausea and vomiting.  Musculoskeletal: Negative for neck pain and neck stiffness.  Neurological: Positive for headaches. Negative for dizziness, syncope, weakness, light-headedness and numbness.     Physical Exam Triage Vital Signs ED Triage Vitals  Enc Vitals Group     BP 09/27/20 0852 (!) 127/92     Pulse Rate 09/27/20 0852 82     Resp 09/27/20 0852 17     Temp 09/27/20 0852 98.3 F (36.8 C)     Temp Source 09/27/20 0852 Oral     SpO2 09/27/20 0852 98 %     Weight 09/27/20 0848 182 lb (82.6 kg)     Height 09/27/20 0848 6\' 2"  (1.88 m)     Head Circumference --      Peak Flow --      Pain Score 09/27/20 0846 6     Pain Loc --      Pain Edu? --      Excl. in GC? --    No data found.  Updated Vital Signs BP (!) 127/92 (BP Location: Left Arm)   Pulse 82   Temp 98.3 F (36.8 C) (Oral)   Resp 17   Ht 6\' 2"  (1.88 m)   Wt 182 lb (82.6 kg)   SpO2 98%   BMI 23.37 kg/m   Visual Acuity Right Eye Distance:   Left Eye Distance:   Bilateral Distance:    Right Eye Near:   Left Eye Near:    Bilateral Near:     Physical Exam Vitals and nursing note reviewed.  Constitutional:      General: He is not in acute distress.    Appearance: Normal appearance. He is well-developed. He is not ill-appearing, toxic-appearing or diaphoretic.  HENT:     Head: Normocephalic and atraumatic.     Right Ear: Tympanic membrane and ear canal normal.     Left Ear: Tympanic  membrane and ear canal normal.     Nose: Nose normal.     Right Sinus: No maxillary sinus tenderness or frontal sinus tenderness.     Left Sinus: No maxillary sinus tenderness or frontal sinus tenderness.     Mouth/Throat:     Lips: Pink.     Mouth: Mucous membranes are dry.  Pharynx: Oropharynx is clear. Uvula midline. Posterior oropharyngeal erythema present. No pharyngeal swelling, oropharyngeal exudate or uvula swelling.  Cardiovascular:     Rate and Rhythm: Normal rate and regular rhythm.  Pulmonary:     Effort: Pulmonary effort is normal. No respiratory distress.     Breath sounds: Normal breath sounds. No stridor. No wheezing, rhonchi or rales.  Musculoskeletal:        General: Normal range of motion.     Cervical back: Normal range of motion and neck supple. No tenderness.  Lymphadenopathy:     Cervical: Cervical adenopathy present.  Skin:    General: Skin is warm and dry.  Neurological:     Mental Status: He is alert and oriented to person, place, and time.  Psychiatric:        Behavior: Behavior normal.      UC Treatments / Results  Labs (all labs ordered are listed, but only abnormal results are displayed) Labs Reviewed  POCT URINALYSIS DIP (MANUAL ENTRY) - Abnormal; Notable for the following components:      Result Value   Blood, UA trace-intact (*)    All other components within normal limits  COMPLETE METABOLIC PANEL WITH GFR  EPSTEIN-BARR VIRUS EARLY D ANTIGEN ANTIBODY, IGG  EPSTEIN-BARR VIRUS VCA, IGG  EPSTEIN-BARR VIRUS VCA, IGM  EPSTEIN-BARR VIRUS NUCLEAR ANTIGEN ANTIBODY, IGG  POCT RAPID STREP A (OFFICE)  POCT CBC W AUTO DIFF ([facility])  POCT MONO SCREEN ([facility])    EKG   Radiology No results found.  Procedures Procedures (including critical care time)  Medications Ordered in UC Medications - No data to display  Initial Impression / Assessment and Plan / UC Course  I have reviewed the triage vital signs and the nursing  notes.  Pertinent labs & imaging results that were available during my care of the patient were reviewed by me and considered in my medical decision making (see chart for details).    Normal neuro exam. UA and vitals: unremarkable Rapid Mono and Strep: NEGATIVE CBC: slight elevated WBC CMP and mono titers: pending Encouraged good hydration and discussed good sleep hygiene encouraged f/u with PCP in 2-3 days if not improving. Advised pt he may benefit from CT head to rule out a blood clot or other intracranial abnormality causing his HA due to persistent headache for the last 2 weeks. Pt declined going to ED today. Discussed symptoms that warrant emergent care in the ED. AVS given  Final Clinical Impressions(s) / UC Diagnoses   Final diagnoses:  Acute pharyngitis, unspecified etiology  Recurrent headache     Discharge Instructions      Be sure to stay well hydrated and get at least 8 hours of sleep at night, this will help with your recurrent headache. You will be notified of the rest of your blood work today and in a few days for the mono test if it is abnormal.  Please follow up with primary care in 2-3 days if you continue to have a headache.  Call 911 or have someone drive you to the hospital for further evaluation and treatment of your headache if it continues to worsen, you develop change in vision, dizziness, vomiting, fever, or other new concerning symptoms develop.     ED Prescriptions    None     PDMP not reviewed this encounter.   Lurene Shadow, PA-C 09/27/20 1050

## 2020-09-27 NOTE — ED Triage Notes (Addendum)
Pt has had a headache for the last 16 days Tongue is cracked Pt states he drinks 4-5 Monster drinks daily Pt does not drink water  Only drinks Sweet tea & Dr Reino Kent Pt had a negative COVID test on 09/15/20 Pt takes Ibuprofen for his headache Throat is red - hurts to swallow NO COVID vaccine No OTC meds today

## 2020-09-27 NOTE — Discharge Instructions (Signed)
  Be sure to stay well hydrated and get at least 8 hours of sleep at night, this will help with your recurrent headache. You will be notified of the rest of your blood work today and in a few days for the mono test if it is abnormal.  Please follow up with primary care in 2-3 days if you continue to have a headache.  Call 911 or have someone drive you to the hospital for further evaluation and treatment of your headache if it continues to worsen, you develop change in vision, dizziness, vomiting, fever, or other new concerning symptoms develop.

## 2020-09-29 ENCOUNTER — Emergency Department (INDEPENDENT_AMBULATORY_CARE_PROVIDER_SITE_OTHER)
Admission: EM | Admit: 2020-09-29 | Discharge: 2020-09-29 | Disposition: A | Discharge status: Home / Self Care | Payer: Medicaid Other | Source: Home / Self Care

## 2020-09-29 ENCOUNTER — Other Ambulatory Visit: Payer: Self-pay

## 2020-09-29 ENCOUNTER — Emergency Department (INDEPENDENT_AMBULATORY_CARE_PROVIDER_SITE_OTHER): Payer: Medicaid Other

## 2020-09-29 DIAGNOSIS — G441 Vascular headache, not elsewhere classified: Secondary | ICD-10-CM | POA: Diagnosis not present

## 2020-09-29 DIAGNOSIS — R519 Headache, unspecified: Secondary | ICD-10-CM

## 2020-09-29 DIAGNOSIS — B370 Candidal stomatitis: Secondary | ICD-10-CM

## 2020-09-29 LAB — COMPLETE METABOLIC PANEL WITH GFR
AG Ratio: 2.1 (calc) (ref 1.0–2.5)
ALT: 14 U/L (ref 9–46)
AST: 12 U/L (ref 10–40)
Albumin: 4.4 g/dL (ref 3.6–5.1)
Alkaline phosphatase (APISO): 39 U/L (ref 36–130)
BUN: 11 mg/dL (ref 7–25)
CO2: 25 mmol/L (ref 20–32)
Calcium: 9.2 mg/dL (ref 8.6–10.3)
Chloride: 107 mmol/L (ref 98–110)
Creat: 0.75 mg/dL (ref 0.60–1.35)
GFR, Est African American: 129 mL/min/{1.73_m2} (ref >=60)
GFR, Est Non African American: 112 mL/min/{1.73_m2} (ref >=60)
Globulin: 2.1 g/dL (calc) (ref 1.9–3.7)
Glucose, Bld: 96 mg/dL (ref 65–99)
Potassium: 4.2 mmol/L (ref 3.5–5.3)
Sodium: 139 mmol/L (ref 135–146)
Total Bilirubin: 0.5 mg/dL (ref 0.2–1.2)
Total Protein: 6.5 g/dL (ref 6.1–8.1)

## 2020-09-29 LAB — EPSTEIN-BARR VIRUS EARLY D ANTIGEN ANTIBODY, IGG: EBV EA IgG: <9 U/mL

## 2020-09-29 LAB — EPSTEIN-BARR VIRUS VCA, IGM: EBV VCA IgM: <36 U/mL

## 2020-09-29 LAB — EPSTEIN-BARR VIRUS VCA, IGG: EBV VCA IgG: >750 U/mL — ABNORMAL HIGH

## 2020-09-29 LAB — EPSTEIN-BARR VIRUS NUCLEAR ANTIGEN ANTIBODY, IGG: EBV NA IgG: 485 U/mL — ABNORMAL HIGH

## 2020-09-29 MED ORDER — FLUCONAZOLE 200 MG PO TABS: 200.0000 mg | ORAL_TABLET | Freq: Every day | ORAL | 0 refills | Status: DC

## 2020-09-29 MED ORDER — METOCLOPRAMIDE HCL 5 MG/ML IJ SOLN
5.0000 mg | Freq: Once | INTRAMUSCULAR | Status: AC
  Administered 2020-09-29: 5 mg via INTRAMUSCULAR

## 2020-09-29 MED ORDER — DEXAMETHASONE SODIUM PHOSPHATE 10 MG/ML IJ SOLN
10.0000 mg | Freq: Once | INTRAMUSCULAR | Status: AC
  Administered 2020-09-29: 10 mg via INTRAMUSCULAR

## 2020-09-29 MED ORDER — KETOROLAC TROMETHAMINE 60 MG/2ML IM SOLN
60.0000 mg | Freq: Once | INTRAMUSCULAR | Status: AC
  Administered 2020-09-29: 60 mg via INTRAMUSCULAR

## 2020-09-29 NOTE — Discharge Instructions (Signed)
  Please take your medication as prescribed to help with your sore throat. It is also recommended you call your primary care provider today for recheck of symptoms and reminder for follow up chest CT for recheck of findings on chest CT in 2019.     Call 911 or have someone drive you to the hospital if symptoms significantly worsening- including worsening headache, dizziness/passing out, chest pain, trouble breathing, or other new concerning symptoms develop.

## 2020-09-29 NOTE — ED Triage Notes (Addendum)
Pt was seen Saturday here in UC for headache. Requested to return today for CT. Still having throat pain and neck tenderness. Strep and mono was neg.   Edited to add: Pt told CT tech that hes also having night sweats and SOB

## 2020-09-29 NOTE — ED Provider Notes (Addendum)
[name]    CSN: [id number] Arrival date & time: 09/29/20  0903      History   Chief Complaint Chief Complaint  Patient presents with  . CT scan    for headache    HPI [name] is a 44 y.o. male.   HPI [name] is a 44 y.o. male presenting to UC with c/o continued frontal headache that started over 2 weeks ago.  He was seen at [facility] two days ago, tested for strep and mono, both were negative.  He has continued to stay well hydrated, get extra rest and cut back on caffeine without relief. Pain is aching, 4/10 and he still has a sore throat.    Pt was taken down to imaging for head CT for persistent new headache.  He mentioned to the tech, SOB but when questioned, pt states he is SOB like he normally is with his asthma.    Past Medical History:  Diagnosis Date  . Asthma   . Depression     Patient Active Problem List   Diagnosis Date Noted  . ADHD (attention deficit hyperactivity disorder) 09/27/2020  . Encounter for long-term (current) use of other medications 09/27/2020  . Mild intermittent asthma 04/02/2020  . Angular cheilitis with candidiasis 02/05/2020  . Neck pain 12/04/2019  . 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 Opioid use disorder 09/27/2019  . Syncope 03/27/2019  . Smoker 03/03/2018  . Depression with anxiety 02/03/2018  . Pulmonary nodules 02/03/2018  . Chest wall pain 01/20/2018  . Anxiety 08/27/2011  . Decreased appetite 08/27/2011  . Insomnia 08/27/2011  . Rash 08/27/2011  . Restless leg 08/27/2011  . Gastroenteritis, non-infectious 06/08/2011  . ASTHMA 05/20/2011  . Unspecified asthma, uncomplicated 05/20/2011    History reviewed. No pertinent surgical history.     Home Medications    Prior to Admission medications   Medication Sig Start Date End Date Taking? Authorizing Provider  albuterol (VENTOLIN HFA) 108 (90 Base) MCG/ACT inhaler Inhale into the lungs. 04/02/20   [provider]    cetirizine (ZYRTEC) 10 MG tablet Take 1 tablet (10 mg total) by mouth daily. 05/04/18   Lurene Shadow, PA-C  diclofenac (VOLTAREN) 75 MG EC tablet Take 1 tablet (75 mg total) by mouth 2 (two) times daily. 04/19/19   Peyton Najjar, MD  fluconazole (DIFLUCAN) 200 MG tablet Take 1 tablet (200 mg total) by mouth daily. 09/29/20   Lurene Shadow, PA-C  gabapentin (NEURONTIN) 600 MG tablet Take 600 mg by mouth as needed.    [provider]  HYDROcodone-acetaminophen (NORCO) 10-325 MG tablet Take 1 tablet by mouth every 6 (six) hours as needed.    [provider]  ibuprofen (ADVIL) 800 MG tablet Take 800 mg by mouth 3 (three) times daily. 09/08/20   [provider]  methocarbamol (ROBAXIN) 500 MG tablet Take 1 or 2 pills in the morning, 1 or 2 in the afternoon, and 2 at bedtime for muscle relaxant 04/19/19   Peyton Najjar, MD  mirtazapine (REMERON) 15 MG tablet Take 1 tablet (15 mg total) by mouth at bedtime. 03/03/18   Monica Becton, MD  predniSONE (DELTASONE) 20 MG tablet 3 tabs po day one, then 2 po daily x 4 days 05/04/18   Lurene Shadow, PA-C  traMADol-acetaminophen (ULTRACET) 37.5-325 MG tablet Take 1 tablet by mouth every 6 (six) hours as needed.    [provider]    [facility]  History Family History  Problem Relation Age of Onset  . Healthy Mother   . Healthy Father     Social History Social History   Tobacco Use  . Smoking status: Current Every Day Smoker    Packs/day: 1.00    Years: 28.00    Pack years: 28.00    Types: Cigarettes  . Smokeless tobacco: Never Used  Vaping Use  . Vaping Use: Never used  Substance Use Topics  . Alcohol use: No  . Drug use: No     Allergies   Other   Review of Systems Review of Systems  Constitutional: Negative for chills and fever.  HENT: Positive for sore throat. Negative for congestion, ear pain, trouble swallowing and voice change.   Respiratory: Positive for cough. Negative for shortness of  breath.   Cardiovascular: Negative for chest pain and palpitations.  Gastrointestinal: Negative for abdominal pain, diarrhea, nausea and vomiting.  Musculoskeletal: Negative for arthralgias, back pain and myalgias.  Skin: Negative for rash.  Neurological: Positive for headaches. Negative for dizziness and light-headedness.  All other systems reviewed and are negative.    Physical Exam Triage Vital Signs ED Triage Vitals [09/29/20 0915]  Enc Vitals Group     BP 134/88     Pulse Rate 84     Resp 17     Temp 98.1 F (36.7 C)     Temp Source Oral     SpO2 98 %     Weight      Height      Head Circumference      Peak Flow      Pain Score 4     Pain Loc      Pain Edu?      Excl. in GC?    No data found.  Updated Vital Signs BP 134/88 (BP Location: Left Arm)   Pulse 84   Temp 98.1 F (36.7 C) (Oral)   Resp 17   SpO2 98%   Visual Acuity Right Eye Distance:   Left Eye Distance:   Bilateral Distance:    Right Eye Near:   Left Eye Near:    Bilateral Near:     Physical Exam Vitals and nursing note reviewed.  Constitutional:      General: He is not in acute distress.    Appearance: Normal appearance. He is well-developed. He is not ill-appearing, toxic-appearing or diaphoretic.  HENT:     Head: Normocephalic and atraumatic.     Right Ear: Tympanic membrane and ear canal normal.     Left Ear: Tympanic membrane and ear canal normal.     Nose: Nose normal.     Right Sinus: No maxillary sinus tenderness or frontal sinus tenderness.     Left Sinus: No maxillary sinus tenderness or frontal sinus tenderness.     Comments: No temporal artery tenderness. No sinus tenderness.     Mouth/Throat:     Lips: Pink.     Mouth: Mucous membranes are moist.     Tongue: Lesions (erythematous with white film) present.     Pharynx: Oropharynx is clear. Uvula midline. Posterior oropharyngeal erythema present. No pharyngeal swelling, oropharyngeal exudate or uvula swelling.     Tonsils:  No tonsillar exudate or tonsillar abscesses.  Eyes:     Extraocular Movements: Extraocular movements intact.     Conjunctiva/sclera: Conjunctivae normal.     Pupils: Pupils are equal, round, and reactive to light.  Cardiovascular:     Rate and Rhythm: Normal rate  and regular rhythm.  Pulmonary:     Effort: Pulmonary effort is normal. No respiratory distress.     Breath sounds: Normal breath sounds. No stridor. No wheezing, rhonchi or rales.  Musculoskeletal:        General: Normal range of motion.     Cervical back: Normal range of motion and neck supple. No tenderness.  Lymphadenopathy:     Cervical: No cervical adenopathy.  Skin:    General: Skin is warm and dry.     Capillary Refill: Capillary refill takes less than 2 seconds.  Neurological:     General: No focal deficit present.     Mental Status: He is alert and oriented to person, place, and time.     Cranial Nerves: No cranial nerve deficit.  Psychiatric:        Mood and Affect: Mood normal.        Behavior: Behavior normal.      UC Treatments / Results  Labs (all labs ordered are listed, but only abnormal results are displayed) Labs Reviewed - No data to display  EKG   Radiology CT Head Wo Contrast  Result Date: 09/29/2020 CLINICAL DATA:  Eighteen days of headache in the temporal region. EXAM: CT HEAD WITHOUT CONTRAST TECHNIQUE: Contiguous axial images were obtained from the base of the skull through the vertex without intravenous contrast. COMPARISON:  03/28/2018 FINDINGS: Brain: No evidence of acute infarction, hemorrhage, hydrocephalus, extra-axial collection or mass lesion/mass effect. Vascular: No hyperdense vessel or unexpected calcification. Skull: No osseous abnormality. Sinuses/Orbits: Visualized paranasal sinuses are clear. Visualized mastoid sinuses are clear. Visualized orbits demonstrate no focal abnormality. Other: None IMPRESSION: No acute intracranial pathology. Electronically Signed   By: Elige Ko    On: 09/29/2020 10:03    Procedures Procedures (including critical care time)  Medications Ordered in UC Medications  ketorolac (TORADOL) injection 60 mg (60 mg Intramuscular Given 09/29/20 1031)  metoCLOPramide (REGLAN) injection 5 mg (5 mg Intramuscular Given 09/29/20 1030)  dexamethasone (DECADRON) injection 10 mg (10 mg Intramuscular Given 09/29/20 1028)    Initial Impression / Assessment and Plan / UC Course  I have reviewed the triage vital signs and the nursing notes.  Pertinent labs & imaging results that were available during my care of the patient were reviewed by me and considered in my medical decision making (see chart for details).     Normal neuro exam. Vitals WNL including normal HR and O2 Sat  Discussed CT head with pt, no acute findings and no evidence of sinusitis. Oral exam more c/w oral thrush today  Will start pt on fluconazole  Encouraged f/u with PCP for current symptoms but also to schedule followup on chest CT findings from 2019.  Pt agreeable, states he had planned to call his PCP later today.  Declined CXR today, denies chest pain or SOB at this time.   Discussed symptoms that warrant emergent care in the ED. AVS given  Final Clinical Impressions(s) / UC Diagnoses   Final diagnoses:  Persistent headaches  Oral thrush     Discharge Instructions      Please take your medication as prescribed to help with your sore throat. It is also recommended you call your primary care provider today for recheck of symptoms and reminder for follow up chest CT for recheck of findings on chest CT in 2019.     Call 911 or have someone drive you to the hospital if symptoms significantly worsening- including worsening headache, dizziness/passing out, chest pain,  trouble breathing, or other new concerning symptoms develop.      ED Prescriptions    Medication Sig Dispense Auth. Provider   fluconazole (DIFLUCAN) 200 MG tablet Take 1 tablet (200 mg total) by  mouth daily. 10 tablet Lurene Shadow, PA-C     PDMP not reviewed this encounter.   Lurene Shadow, PA-C 09/29/20 1100    [address] [place] 09/29/20 1104

## 2021-04-26 DIAGNOSIS — U071 COVID-19: Secondary | ICD-10-CM

## 2021-04-26 HISTORY — DX: COVID-19: U07.1

## 2021-05-08 ENCOUNTER — Other Ambulatory Visit: Payer: Self-pay

## 2021-05-08 ENCOUNTER — Emergency Department (INDEPENDENT_AMBULATORY_CARE_PROVIDER_SITE_OTHER)
Admission: EM | Admit: 2021-05-08 | Discharge: 2021-05-08 | Disposition: A | Discharge status: Home / Self Care | Payer: Medicaid Other | Source: Home / Self Care

## 2021-05-08 ENCOUNTER — Encounter: Payer: Self-pay | Admitting: Emergency Medicine

## 2021-05-08 DIAGNOSIS — B349 Viral infection, unspecified: Secondary | ICD-10-CM

## 2021-05-08 DIAGNOSIS — R509 Fever, unspecified: Secondary | ICD-10-CM

## 2021-05-08 DIAGNOSIS — J309 Allergic rhinitis, unspecified: Secondary | ICD-10-CM | POA: Diagnosis not present

## 2021-05-08 HISTORY — DX: Unspecified glaucoma: H40.9

## 2021-05-08 MED ORDER — IBUPROFEN 800 MG PO TABS
800.0000 mg | ORAL_TABLET | ORAL | Status: AC
  Administered 2021-05-08: 800 mg via ORAL

## 2021-05-08 MED ORDER — ACETAMINOPHEN 325 MG PO TABS
650.0000 mg | ORAL_TABLET | ORAL | Status: AC
  Administered 2021-05-08: 650 mg via ORAL

## 2021-05-08 NOTE — ED Triage Notes (Signed)
Sudden onset of fever & body aches at 1100 Took Tylenol & cold sinus at 1200 TMax 101.6 Headache  Congestion  Denies COVID vaccine or COVID in the last 2 years

## 2021-05-08 NOTE — Discharge Instructions (Addendum)
Advised/instructed patient conservative measures for now may alternate between Tylenol 1000 mg and Ibuprofen 800 mg every 8 hours for fever/myalgias.  We will follow-up with COVID-19/flu A&B results once received.  Advised/instructed patient to take Allegra 180 mg daily for the next 7 days, then as needed.

## 2021-05-08 NOTE — ED Provider Notes (Signed)
[name]    CSN: [id number] Arrival date & time: 05/08/21  1546      History   Chief Complaint Chief Complaint  Patient presents with  . Fever    Body   . Generalized Body Aches    HPI [name] is a 45 y.o. male.   HPI 45 year-old male presents with fever and body aches since 11 AM.  And also complains of headache, and nasal congestion since 11 AM as well.  Past Medical History:  Diagnosis Date  . Asthma   . Depression   . Glaucoma     Patient Active Problem List   Diagnosis Date Noted  . ADHD (attention deficit hyperactivity disorder) 09/27/2020  . Encounter for long-term (current) use of other medications 09/27/2020  . Mild intermittent asthma 04/02/2020  . Angular cheilitis with candidiasis 02/05/2020  . Neck pain 12/04/2019  . 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 Opioid use disorder 09/27/2019  . Syncope 03/27/2019  . Smoker 03/03/2018  . Depression with anxiety 02/03/2018  . Pulmonary nodules 02/03/2018  . Chest wall pain 01/20/2018  . Anxiety 08/27/2011  . Decreased appetite 08/27/2011  . Insomnia 08/27/2011  . Rash 08/27/2011  . Restless leg 08/27/2011  . Gastroenteritis, non-infectious 06/08/2011  . ASTHMA 05/20/2011  . Unspecified asthma, uncomplicated 05/20/2011    History reviewed. No pertinent surgical history.     Home Medications    Prior to Admission medications   Medication Sig Start Date End Date Taking? Authorizing Provider  albuterol (VENTOLIN HFA) 108 (90 Base) MCG/ACT inhaler Inhale into the lungs. 04/02/20  Yes [provider]  latanoprost (XALATAN) 0.005 % ophthalmic solution Place 1 drop into both eyes at bedtime. 08/01/19  Yes [provider]  timolol (TIMOPTIC) 0.5 % ophthalmic solution Place 1 drop into both eyes every morning. 08/01/19  Yes [provider]  cetirizine (ZYRTEC) 10 MG tablet Take 1 tablet (10 mg total) by mouth daily. Patient not taking: Reported  on 05/08/2021 05/04/18   Lurene Shadow, PA-C  diclofenac (VOLTAREN) 75 MG EC tablet Take 1 tablet (75 mg total) by mouth 2 (two) times daily. Patient not taking: Reported on 05/08/2021 04/19/19   Peyton Najjar, MD  fluconazole (DIFLUCAN) 200 MG tablet Take 1 tablet (200 mg total) by mouth daily. Patient not taking: Reported on 05/08/2021 09/29/20   Lurene Shadow, PA-C  gabapentin (NEURONTIN) 600 MG tablet Take 600 mg by mouth as needed. Patient not taking: Reported on 05/08/2021    [provider]  HYDROcodone-acetaminophen (NORCO) 10-325 MG tablet Take 1 tablet by mouth every 6 (six) hours as needed.    [provider]  ibuprofen (ADVIL) 800 MG tablet Take 800 mg by mouth 3 (three) times daily. 09/08/20   [provider]  methocarbamol (ROBAXIN) 500 MG tablet Take 1 or 2 pills in the morning, 1 or 2 in the afternoon, and 2 at bedtime for muscle relaxant Patient not taking: Reported on 05/08/2021 04/19/19   Peyton Najjar, MD  mirtazapine (REMERON) 15 MG tablet Take 1 tablet (15 mg total) by mouth at bedtime. Patient not taking: Reported on 05/08/2021 03/03/18   Monica Becton, MD  predniSONE (DELTASONE) 20 MG tablet 3 tabs po day one, then 2 po daily x 4 days Patient not taking: Reported on 05/08/2021 05/04/18   Lurene Shadow, PA-C  traMADol-acetaminophen (ULTRACET) 37.5-325 MG tablet Take 1 tablet by mouth every 6 (six) hours as needed. Patient  not taking: Reported on 05/08/2021    [provider]    Family History Family History  Problem Relation Age of Onset  . Healthy Mother   . Healthy Father     Social History Social History   Tobacco Use  . Smoking status: Current Every Day Smoker    Packs/day: 1.00    Years: 28.00    Pack years: 28.00    Types: Cigarettes  . Smokeless tobacco: Never Used  Vaping Use  . Vaping Use: Never used  Substance Use Topics  . Alcohol use: No  . Drug use: No     Allergies   Other   Review of  Systems Review of Systems  Constitutional: Positive for chills, fatigue and fever.  HENT: Positive for congestion.   Eyes: Negative.   Respiratory: Negative.   Cardiovascular: Negative.   Gastrointestinal: Negative.   Genitourinary: Negative.   Musculoskeletal: Positive for myalgias.  Skin: Negative.   Neurological: Negative.      Physical Exam Triage Vital Signs ED Triage Vitals  Enc Vitals Group     BP 05/08/21 1555 128/74     Pulse Rate 05/08/21 1555 (!) 104     Resp 05/08/21 1555 16     Temp 05/08/21 1555 (!) 101 F (38.3 C)     Temp Source 05/08/21 1555 Oral     SpO2 05/08/21 1555 97 %     Weight --      Height --      Head Circumference --      Peak Flow --      Pain Score 05/08/21 1600 9     Pain Loc --      Pain Edu? --      Excl. in GC? --    No data found.  Updated Vital Signs BP 128/74 (BP Location: Right Arm)   Pulse (!) 104   Temp (!) 101 F (38.3 C) (Oral)   Resp 16   SpO2 97%  Physical Exam Vitals and nursing note reviewed.  Constitutional:      General: He is not in acute distress.    Appearance: Normal appearance. He is ill-appearing.  HENT:     Head: Normocephalic and atraumatic.     Right Ear: Tympanic membrane and ear canal normal.     Left Ear: Tympanic membrane and ear canal normal.     Nose: Congestion present.     Mouth/Throat:     Mouth: Mucous membranes are moist.     Pharynx: Oropharynx is clear.     Comments: Moderate clear drainage of posterior oropharynx noted Eyes:     Extraocular Movements: Extraocular movements intact.     Conjunctiva/sclera: Conjunctivae normal.     Pupils: Pupils are equal, round, and reactive to light.  Cardiovascular:     Rate and Rhythm: Normal rate and regular rhythm.     Pulses: Normal pulses.     Heart sounds: Normal heart sounds.  Pulmonary:     Effort: Pulmonary effort is normal. No respiratory distress.     Breath sounds: Normal breath sounds. No stridor. No wheezing, rhonchi or rales.   Musculoskeletal:     Cervical back: Normal range of motion and neck supple.  Lymphadenopathy:     Cervical: No cervical adenopathy.  Skin:    General: Skin is warm and dry.  Neurological:     General: No focal deficit present.     Mental Status: He is alert and oriented to person, place, and time.  Psychiatric:        Mood and Affect: Mood normal.        Behavior: Behavior normal.      UC Treatments / Results  Labs (all labs ordered are listed, but only abnormal results are displayed) Labs Reviewed  COVID-19, FLU A+B NAA    EKG   Radiology No results found.  Procedures Procedures (including critical care time)  Medications Ordered in UC Medications  acetaminophen (TYLENOL) tablet 650 mg (650 mg Oral Given 05/08/21 1615)  ibuprofen (ADVIL) tablet 800 mg (800 mg Oral Given 05/08/21 1615)    Initial Impression / Assessment and Plan / UC Course  I have reviewed the triage vital signs and the nursing notes.  Pertinent labs & imaging results that were available during my care of the patient were reviewed by me and considered in my medical decision making (see chart for details).     MDM: 1.  Fever, 2.  Viral illness, 3.  Allergic rhinitis.  COVID-19/flu A&B ordered, patient discharged home, hemodynamically stable. Final Clinical Impressions(s) / UC Diagnoses   Final diagnoses:  Fever, unspecified  Viral illness  Allergic rhinitis, unspecified seasonality, unspecified trigger     Discharge Instructions     Advised/instructed patient conservative measures for now may alternate between Tylenol 1000 mg and Ibuprofen 800 mg every 8 hours for fever/myalgias.  We will follow-up with COVID-19/flu A&B results once received.  Advised/instructed patient to take Allegra 180 mg daily for the next 7 days, then as needed.    ED Prescriptions    None     PDMP not reviewed this encounter.   Trevor Iha, FNP 05/08/21 1700

## 2021-05-11 LAB — COVID-19, FLU A+B NAA
Influenza A, NAA: NOT DETECTED
Influenza B, NAA: NOT DETECTED
SARS-CoV-2, NAA: DETECTED — AB

## 2021-07-18 ENCOUNTER — Encounter: Payer: Self-pay | Admitting: Emergency Medicine

## 2021-07-18 ENCOUNTER — Emergency Department (INDEPENDENT_AMBULATORY_CARE_PROVIDER_SITE_OTHER)
Admission: EM | Admit: 2021-07-18 | Discharge: 2021-07-18 | Disposition: A | Discharge status: Home / Self Care | Payer: Medicaid Other | Source: Home / Self Care

## 2021-07-18 ENCOUNTER — Other Ambulatory Visit: Payer: Self-pay

## 2021-07-18 ENCOUNTER — Other Ambulatory Visit (HOSPITAL_COMMUNITY)
Admission: RE | Admit: 2021-07-18 | Discharge: 2021-07-18 | Disposition: A | Discharge status: Home / Self Care | Payer: Medicaid Other | Source: Ambulatory Visit | Attending: Family Medicine | Admitting: Family Medicine

## 2021-07-18 DIAGNOSIS — J029 Acute pharyngitis, unspecified: Secondary | ICD-10-CM | POA: Diagnosis not present

## 2021-07-18 LAB — POCT URINALYSIS DIP (MANUAL ENTRY)
Bilirubin, UA: NEGATIVE
Blood, UA: NEGATIVE
Glucose, UA: NEGATIVE mg/dL
Ketones, POC UA: NEGATIVE mg/dL
Leukocytes, UA: NEGATIVE
Nitrite, UA: NEGATIVE
Protein Ur, POC: NEGATIVE mg/dL
Spec Grav, UA: 1.015 (ref 1.010–1.025)
Urobilinogen, UA: 0.2 E.U./dL
pH, UA: 7 (ref 5.0–8.0)

## 2021-07-18 LAB — POCT RAPID STREP A (OFFICE): Rapid Strep A Screen: NEGATIVE

## 2021-07-18 NOTE — ED Triage Notes (Signed)
Sore throat since yesterday afternoon  Denies fever No OTC meds

## 2021-07-18 NOTE — ED Provider Notes (Signed)
[facility]  ____________________________________________  Time seen: Approximately 8:23 AM  I have reviewed the triage vital signs and the nursing notes.   HISTORY  Chief Complaint Sore Throat   Historian Patient     HPI [name] is a 45 y.o. male presents to the emergency department with pharyngitis that started yesterday.  No associated rhinorrhea, nasal congestion or nonproductive cough.  Patient has had no difficulty maintaining his own secretions and has been speaking in complete sentences.  No prior history of peritonsillar abscess.  No sick contacts in the home with similar symptoms.  No chest pain, chest tightness or abdominal pain.   Past Medical History:  Diagnosis Date   Asthma    Depression    Glaucoma      Immunizations up to date:  Yes.     Past Medical History:  Diagnosis Date   Asthma    Depression    Glaucoma     Patient Active Problem List   Diagnosis Date Noted   ADHD (attention deficit hyperactivity disorder) 09/27/2020   Encounter for long-term (current) use of other medications 09/27/2020   Mild intermittent asthma 04/02/2020   Angular cheilitis with candidiasis 02/05/2020   Neck pain 12/04/2019   Opioid dependence on agonist therapy (HCC) 12/04/2019   Opioid use disorder 09/27/2019   Syncope 03/27/2019   Smoker 03/03/2018   Depression with anxiety 02/03/2018   Pulmonary nodules 02/03/2018   Chest wall pain 01/20/2018   Anxiety 08/27/2011   Decreased appetite 08/27/2011   Insomnia 08/27/2011   Rash 08/27/2011   Restless leg 08/27/2011   Gastroenteritis, non-infectious 06/08/2011   ASTHMA 05/20/2011   Unspecified asthma, uncomplicated 05/20/2011    History reviewed. No pertinent surgical history.  Prior to Admission medications   Medication Sig Start Date End Date Taking? Authorizing Provider  ALPRAZolam Prudy Feeler) 1 MG tablet Take by mouth. 05/20/21  Yes [provider]  fluticasone (FLOVENT HFA) 44  MCG/ACT inhaler Inhale 2 puffs into the lungs 2 (two) times daily. 03/18/21  Yes [provider]  meloxicam (MOBIC) 15 MG tablet TAKE 1 TABLET BY MOUTH EVERY DAY WITH FOOD FOR 2 WEEKS AS NEEDED FOR PAIN 06/12/21  Yes [provider]  albuterol (VENTOLIN HFA) 108 (90 Base) MCG/ACT inhaler Inhale into the lungs. 04/02/20   [provider]  cetirizine (ZYRTEC) 10 MG tablet Take 1 tablet (10 mg total) by mouth daily. Patient not taking: Reported on 05/08/2021 05/04/18   Lurene Shadow, PA-C  clobetasol (TEMOVATE) 0.05 % external solution Apply topically 2 (two) times daily. 07/07/21   [provider]  diclofenac (VOLTAREN) 75 MG EC tablet Take 1 tablet (75 mg total) by mouth 2 (two) times daily. Patient not taking: Reported on 05/08/2021 04/19/19   Peyton Najjar, MD  fluconazole (DIFLUCAN) 200 MG tablet Take 1 tablet (200 mg total) by mouth daily. Patient not taking: Reported on 05/08/2021 09/29/20   Lurene Shadow, PA-C  gabapentin (NEURONTIN) 600 MG tablet Take 600 mg by mouth as needed. Patient not taking: Reported on 05/08/2021    [provider]  HYDROcodone-acetaminophen (NORCO) 10-325 MG tablet Take 1 tablet by mouth every 6 (six) hours as needed.    [provider]  hydrOXYzine (ATARAX/VISTARIL) 25 MG tablet Take 25 mg by mouth 3 (three) times daily. 06/18/21   [provider]  ibuprofen (ADVIL) 800 MG tablet Take 800 mg by mouth 3 (three) times daily. 09/08/20   [provider]  latanoprost (XALATAN) 0.005 %  ophthalmic solution Place 1 drop into both eyes at bedtime. 08/01/19   [provider]  methocarbamol (ROBAXIN) 500 MG tablet Take 1 or 2 pills in the morning, 1 or 2 in the afternoon, and 2 at bedtime for muscle relaxant Patient not taking: Reported on 05/08/2021 04/19/19   Peyton Najjar, MD  mirtazapine (REMERON) 15 MG tablet Take 1 tablet (15 mg total) by mouth at bedtime. Patient not taking: Reported on 05/08/2021 03/03/18    Monica Becton, MD  predniSONE (DELTASONE) 20 MG tablet 3 tabs po day one, then 2 po daily x 4 days Patient not taking: Reported on 05/08/2021 05/04/18   Lurene Shadow, PA-C  sildenafil (REVATIO) 20 MG tablet SMARTSIG:3-5 Tablet(s) By Mouth 07/02/21   [provider]  timolol (TIMOPTIC) 0.5 % ophthalmic solution Place 1 drop into both eyes every morning. 08/01/19   [provider]  traMADol-acetaminophen (ULTRACET) 37.5-325 MG tablet Take 1 tablet by mouth every 6 (six) hours as needed. Patient not taking: Reported on 05/08/2021    [provider]    Allergies Other  Family History  Problem Relation Age of Onset   Healthy Mother    Healthy Father     Social History Social History   Tobacco Use   Smoking status: Every Day    Packs/day: 1.00    Years: 28.00    Pack years: 28.00    Types: Cigarettes   Smokeless tobacco: Never  Vaping Use   Vaping Use: Never used  Substance Use Topics   Alcohol use: No   Drug use: No     Review of Systems  Constitutional: No fever/chills Eyes:  No discharge ENT: Patient has pharyngitis.  Respiratory: no cough. No SOB/ use of accessory muscles to breath Gastrointestinal:   No nausea, no vomiting.  No diarrhea.  No constipation. Musculoskeletal: Negative for musculoskeletal pain. Skin: Negative for rash, abrasions, lacerations, ecchymosis.    ____________________________________________   PHYSICAL EXAM:  VITAL SIGNS: ED Triage Vitals  Enc Vitals Group     BP 07/18/21 0812 121/80     Pulse Rate 07/18/21 0812 66     Resp 07/18/21 0812 17     Temp 07/18/21 0812 98 F (36.7 C)     Temp Source 07/18/21 0812 Oral     SpO2 07/18/21 0812 100 %     Weight --      Height --      Head Circumference --      Peak Flow --      Pain Score 07/18/21 0813 4     Pain Loc --      Pain Edu? --      Excl. in GC? --      Constitutional: Alert and oriented. Well appearing and in no acute distress. Eyes:  Conjunctivae are normal. PERRL. EOMI. Head: Atraumatic. ENT:      Ears:       Nose: No congestion/rhinnorhea.      Mouth/Throat: Mucous membranes are moist.  Posterior pharynx is erythematous.  Uvula is midline. Neck: No stridor.  No cervical spine tenderness to palpation. Cardiovascular: Normal rate, regular rhythm. Normal S1 and S2.  Good peripheral circulation. Respiratory: Normal respiratory effort without tachypnea or retractions. Lungs CTAB. Good air entry to the bases with no decreased or absent breath sounds Gastrointestinal: Bowel sounds x 4 quadrants. Soft and nontender to palpation. No guarding or rigidity. No distention. Musculoskeletal: Full range of motion to all extremities. No obvious deformities noted Neurologic:  Normal for age. No gross focal neurologic deficits are appreciated.  Skin:  Skin is warm, dry and intact. No rash noted. Psychiatric: Mood and affect are normal for age. Speech and behavior are normal.   ____________________________________________   LABS (all labs ordered are listed, but only abnormal results are displayed)  Labs Reviewed  COVID-19, FLU A+B NAA  POCT RAPID STREP A (OFFICE)   ____________________________________________  EKG   ____________________________________________  RADIOLOGY Geraldo Pitter, personally viewed and evaluated these images (plain radiographs) as part of my medical decision making, as well as reviewing the written report by the radiologist.  No results found.  ____________________________________________    PROCEDURES  Procedure(s) performed:     Procedures     Medications - No data to display   ____________________________________________   INITIAL IMPRESSION / ASSESSMENT AND PLAN / ED COURSE  Pertinent labs & imaging results that were available during my care of the patient were reviewed by me and considered in my medical decision making (see chart for details).      Assessment and Plan:   Pharyngitis 45 year old male presents to the urgent care with pharyngitis that started yesterday.  Strep was negative.  Culture is in process at this time.  Prior to leaving urgent care, patient also endorsed some dysuria that had started today.  Denies concerns for STDs.  Urinalysis showed no signs of UTI.  Testing for gonorrhea and chlamydia is in process at this time.  Testing for COVID-19 and influenza a and B are also in process at this time.  Rest and increase hydration were encouraged at home.  Return precautions were given to return with new or worsening symptoms.    ____________________________________________  FINAL CLINICAL IMPRESSION(S) / ED DIAGNOSES  Final diagnoses:  Acute pharyngitis, unspecified etiology      NEW MEDICATIONS STARTED DURING THIS VISIT:  ED Discharge Orders     None           This chart was dictated using voice recognition software/Dragon. Despite best efforts to proofread, errors can occur which can change the meaning. Any change was purely unintentional.     Orvil Feil, PA-C 07/18/21 1059

## 2021-07-18 NOTE — ED Notes (Signed)
Canceled G/C test for [facility]. Orders changed to [facility] - see orders

## 2021-07-20 LAB — COVID-19, FLU A+B NAA
Influenza A, NAA: NOT DETECTED
Influenza B, NAA: NOT DETECTED
SARS-CoV-2, NAA: NOT DETECTED

## 2021-07-20 LAB — C. TRACHOMATIS/N. GONORRHOEAE RNA

## 2021-07-21 LAB — CYTOLOGY, (ORAL, ANAL, URETHRAL) ANCILLARY ONLY
Chlamydia: NEGATIVE
Comment: NEGATIVE
Comment: NEGATIVE
Comment: NORMAL
Neisseria Gonorrhea: NEGATIVE
Trichomonas: NEGATIVE

## 2021-07-22 LAB — CULTURE, GROUP A STREP

## 2021-07-23 ENCOUNTER — Telehealth (HOSPITAL_COMMUNITY): Payer: Self-pay | Admitting: Emergency Medicine

## 2021-07-23 MED ORDER — PENICILLIN V POTASSIUM 500 MG PO TABS: 500.0000 mg | ORAL_TABLET | Freq: Two times a day (BID) | ORAL | 0 refills | Status: AC

## 2021-10-17 ENCOUNTER — Emergency Department (HOSPITAL_COMMUNITY)
Admission: EM | Admit: 2021-10-17 | Discharge: 2021-10-17 | Disposition: A | Discharge status: Home / Self Care | Payer: Medicaid Other | Attending: Emergency Medicine | Admitting: Emergency Medicine

## 2021-10-17 ENCOUNTER — Emergency Department (HOSPITAL_COMMUNITY): Payer: Medicaid Other

## 2021-10-17 ENCOUNTER — Encounter (HOSPITAL_COMMUNITY): Payer: Self-pay

## 2021-10-17 ENCOUNTER — Emergency Department (INDEPENDENT_AMBULATORY_CARE_PROVIDER_SITE_OTHER)
Admission: EM | Admit: 2021-10-17 | Discharge: 2021-10-17 | Disposition: A | Discharge status: Home / Self Care | Payer: Medicaid Other | Source: Home / Self Care

## 2021-10-17 ENCOUNTER — Encounter: Payer: Self-pay | Admitting: Emergency Medicine

## 2021-10-17 ENCOUNTER — Other Ambulatory Visit: Payer: Self-pay

## 2021-10-17 DIAGNOSIS — S069X0A Unspecified intracranial injury without loss of consciousness, initial encounter: Secondary | ICD-10-CM | POA: Diagnosis not present

## 2021-10-17 DIAGNOSIS — W19XXXA Unspecified fall, initial encounter: Secondary | ICD-10-CM | POA: Diagnosis not present

## 2021-10-17 DIAGNOSIS — R519 Headache, unspecified: Secondary | ICD-10-CM

## 2021-10-17 DIAGNOSIS — H53149 Visual discomfort, unspecified: Secondary | ICD-10-CM | POA: Diagnosis present

## 2021-10-17 DIAGNOSIS — F0781 Postconcussional syndrome: Secondary | ICD-10-CM

## 2021-10-17 HISTORY — DX: Anxiety disorder, unspecified: F41.9

## 2021-10-17 LAB — BASIC METABOLIC PANEL
Anion gap: 4 — ABNORMAL LOW (ref 5–15)
BUN: 8 mg/dL (ref 6–20)
CO2: 25 mmol/L (ref 22–32)
Calcium: 8.8 mg/dL — ABNORMAL LOW (ref 8.9–10.3)
Chloride: 108 mmol/L (ref 98–111)
Creatinine, Ser: 0.71 mg/dL (ref 0.61–1.24)
GFR, Estimated: >60 mL/min (ref >60)
Glucose, Bld: 98 mg/dL (ref 70–99)
Potassium: 3.9 mmol/L (ref 3.5–5.1)
Sodium: 137 mmol/L (ref 135–145)

## 2021-10-17 LAB — CBC WITH DIFFERENTIAL/PLATELET
Abs Immature Granulocytes: 0.03 10*3/uL (ref 0.00–0.07)
Basophils Absolute: 0 10*3/uL (ref 0.0–0.1)
Basophils Relative: 0 %
Eosinophils Absolute: 0.2 10*3/uL (ref 0.0–0.5)
Eosinophils Relative: 2 %
HCT: 39.4 % (ref 39.0–52.0)
Hemoglobin: 13.8 g/dL (ref 13.0–17.0)
Immature Granulocytes: 0 %
Lymphocytes Relative: 30 %
Lymphs Abs: 2.5 10*3/uL (ref 0.7–4.0)
MCH: 34.3 pg — ABNORMAL HIGH (ref 26.0–34.0)
MCHC: 35 g/dL (ref 30.0–36.0)
MCV: 98 fL (ref 80.0–100.0)
Monocytes Absolute: 0.7 10*3/uL (ref 0.1–1.0)
Monocytes Relative: 8 %
Neutro Abs: 4.9 10*3/uL (ref 1.7–7.7)
Neutrophils Relative %: 60 %
Platelets: 219 10*3/uL (ref 150–400)
RBC: 4.02 MIL/uL — ABNORMAL LOW (ref 4.22–5.81)
RDW: 12.6 % (ref 11.5–15.5)
WBC: 8.3 10*3/uL (ref 4.0–10.5)
nRBC: 0 % (ref 0.0–0.2)

## 2021-10-17 MED ORDER — METOCLOPRAMIDE HCL 5 MG/ML IJ SOLN
10.0000 mg | Freq: Once | INTRAMUSCULAR | Status: AC
  Administered 2021-10-17: 10 mg via INTRAVENOUS
  Filled 2021-10-17: qty 2

## 2021-10-17 MED ORDER — DIPHENHYDRAMINE HCL 50 MG/ML IJ SOLN
12.5000 mg | Freq: Once | INTRAMUSCULAR | Status: AC
  Administered 2021-10-17: 12.5 mg via INTRAVENOUS
  Filled 2021-10-17: qty 1

## 2021-10-17 MED ORDER — SODIUM CHLORIDE 0.9 % IV BOLUS
1000.0000 mL | Freq: Once | INTRAVENOUS | Status: AC
  Administered 2021-10-17: 1000 mL via INTRAVENOUS

## 2021-10-17 MED ORDER — DEXAMETHASONE SODIUM PHOSPHATE 4 MG/ML IJ SOLN
4.0000 mg | Freq: Once | INTRAMUSCULAR | Status: AC
  Administered 2021-10-17: 4 mg via INTRAVENOUS
  Filled 2021-10-17: qty 1

## 2021-10-17 MED ORDER — KETOROLAC TROMETHAMINE 30 MG/ML IJ SOLN
30.0000 mg | Freq: Once | INTRAMUSCULAR | Status: AC
  Administered 2021-10-17: 30 mg via INTRAVENOUS
  Filled 2021-10-17: qty 1

## 2021-10-17 NOTE — ED Provider Notes (Signed)
[name]    CSN: [id number] Arrival date & time: 10/17/21  1522      History   Chief Complaint Chief Complaint  Patient presents with   Headache    HPI [name] is a 45 y.o. male.   HPI patient presents with headache that started on 10/09/2021.  No relief with Aleve, Bayer, Tylenol, Excedrin, Advil.  Patient reports taking 3 Bayer aspirin at 1:30 PM today patient reports loss of vision in both eyes yesterday and resolved within 3 minutes.  With no other vision loss.  Patient reports was moving furniture on 1013 hit his head while moving a sofa and had small gash and did not have head evaluated after this incident.  Past Medical History:  Diagnosis Date   Asthma    COVID-19 04/2021   Depression    Glaucoma     Patient Active Problem List   Diagnosis Date Noted   ADHD (attention deficit hyperactivity disorder) 09/27/2020   Encounter for long-term (current) use of other medications 09/27/2020   Mild intermittent asthma 04/02/2020   Angular cheilitis with candidiasis 02/05/2020   Neck pain 12/04/2019   Opioid dependence on agonist therapy (HCC) 12/04/2019   Opioid use disorder 09/27/2019   Syncope 03/27/2019   Smoker 03/03/2018   Depression with anxiety 02/03/2018   Pulmonary nodules 02/03/2018   Chest wall pain 01/20/2018   Anxiety 08/27/2011   Decreased appetite 08/27/2011   Insomnia 08/27/2011   Rash 08/27/2011   Restless leg 08/27/2011   Gastroenteritis, non-infectious 06/08/2011   ASTHMA 05/20/2011   Unspecified asthma, uncomplicated 05/20/2011    History reviewed. No pertinent surgical history.     Home Medications    Prior to Admission medications   Medication Sig Start Date End Date Taking? Authorizing Provider  albuterol (VENTOLIN HFA) 108 (90 Base) MCG/ACT inhaler Inhale into the lungs. 04/02/20   [provider]  ALPRAZolam Prudy Feeler) 1 MG tablet Take by mouth. 05/20/21   [provider]  cetirizine (ZYRTEC) 10  MG tablet Take 1 tablet (10 mg total) by mouth daily. Patient not taking: Reported on 05/08/2021 05/04/18   Lurene Shadow, PA-C  clobetasol (TEMOVATE) 0.05 % external solution Apply topically 2 (two) times daily. 07/07/21   [provider]  diclofenac (VOLTAREN) 75 MG EC tablet Take 1 tablet (75 mg total) by mouth 2 (two) times daily. Patient not taking: Reported on 05/08/2021 04/19/19   Peyton Najjar, MD  fluconazole (DIFLUCAN) 200 MG tablet Take 1 tablet (200 mg total) by mouth daily. Patient not taking: Reported on 05/08/2021 09/29/20   Lurene Shadow, PA-C  fluticasone (FLOVENT HFA) 44 MCG/ACT inhaler Inhale 2 puffs into the lungs 2 (two) times daily. 03/18/21   [provider]  gabapentin (NEURONTIN) 600 MG tablet Take 600 mg by mouth as needed. Patient not taking: Reported on 05/08/2021    [provider]  HYDROcodone-acetaminophen (NORCO) 10-325 MG tablet Take 1 tablet by mouth every 6 (six) hours as needed. Patient not taking: Reported on 10/17/2021    [provider]  hydrOXYzine (ATARAX/VISTARIL) 25 MG tablet Take 25 mg by mouth 3 (three) times daily. 06/18/21   [provider]  ibuprofen (ADVIL) 800 MG tablet Take 800 mg by mouth 3 (three) times daily. 09/08/20   [provider]  latanoprost (XALATAN) 0.005 % ophthalmic solution Place 1 drop into both eyes at bedtime. 08/01/19   [provider]  meloxicam (MOBIC) 15 MG tablet TAKE 1 TABLET BY MOUTH  EVERY DAY WITH FOOD FOR 2 WEEKS AS NEEDED FOR PAIN Patient not taking: Reported on 10/17/2021 06/12/21   [provider]  methocarbamol (ROBAXIN) 500 MG tablet Take 1 or 2 pills in the morning, 1 or 2 in the afternoon, and 2 at bedtime for muscle relaxant Patient not taking: Reported on 05/08/2021 04/19/19   Peyton Najjar, MD  mirtazapine (REMERON) 15 MG tablet Take 1 tablet (15 mg total) by mouth at bedtime. Patient not taking: Reported on 05/08/2021 03/03/18   Monica Becton,  MD  predniSONE (DELTASONE) 20 MG tablet 3 tabs po day one, then 2 po daily x 4 days Patient not taking: Reported on 05/08/2021 05/04/18   Lurene Shadow, PA-C  sildenafil (REVATIO) 20 MG tablet SMARTSIG:3-5 Tablet(s) By Mouth 07/02/21   [provider]  timolol (TIMOPTIC) 0.5 % ophthalmic solution Place 1 drop into both eyes every morning. 08/01/19   [provider]  traMADol-acetaminophen (ULTRACET) 37.5-325 MG tablet Take 1 tablet by mouth every 6 (six) hours as needed. Patient not taking: Reported on 05/08/2021    [provider]  varenicline (CHANTIX) 0.5 MG tablet Take by mouth. 10/10/21   [provider]    Family History Family History  Problem Relation Age of Onset   Healthy Mother    Healthy Father     Social History Social History   Tobacco Use   Smoking status: Every Day    Packs/day: 0.50    Years: 28.00    Pack years: 14.00    Types: Cigarettes   Smokeless tobacco: Never  Vaping Use   Vaping Use: Never used  Substance Use Topics   Alcohol use: No   Drug use: No     Allergies   Other   Review of Systems Review of Systems  Neurological:  Positive for headaches.  All other systems reviewed and are negative.   Physical Exam Triage Vital Signs ED Triage Vitals  Enc Vitals Group     BP 10/17/21 1534 135/90     Pulse Rate 10/17/21 1534 81     Resp 10/17/21 1534 17     Temp 10/17/21 1534 98.3 F (36.8 C)     Temp Source 10/17/21 1534 Oral     SpO2 10/17/21 1534 94 %     Weight 10/17/21 1536 184 lb (83.5 kg)     Height 10/17/21 1536 6\' 2"  (1.88 m)     Head Circumference --      Peak Flow --      Pain Score 10/17/21 1535 10     Pain Loc --      Pain Edu? --      Excl. in GC? --    No data found.  Updated Vital Signs BP 135/90 (BP Location: Left Arm)   Pulse 81   Temp 98.3 F (36.8 C) (Oral)   Resp 17   Ht 6\' 2"  (1.88 m)   Wt 184 lb (83.5 kg)   SpO2 94%   BMI 23.62 kg/m    Physical Exam Vitals and nursing  note reviewed.  Constitutional:      General: He is not in acute distress.    Appearance: He is well-developed and normal weight. He is not ill-appearing.  HENT:     Head: Normocephalic and atraumatic.     Mouth/Throat:     Mouth: Mucous membranes are moist.     Pharynx: Oropharynx is clear.  Eyes:     General: No visual field deficit.  Extraocular Movements: Extraocular movements intact.     Pupils: Pupils are equal, round, and reactive to light.  Cardiovascular:     Rate and Rhythm: Normal rate and regular rhythm.     Heart sounds: Normal heart sounds.  Pulmonary:     Effort: Pulmonary effort is normal.     Breath sounds: Normal breath sounds.  Musculoskeletal:     Cervical back: Normal range of motion and neck supple.  Skin:    General: Skin is warm and dry.  Neurological:     Mental Status: He is alert and oriented to person, place, and time.     Cranial Nerves: No cranial nerve deficit, dysarthria or facial asymmetry.     Motor: No weakness.     Coordination: Romberg sign negative. Coordination normal.     Gait: Gait normal.     UC Treatments / Results  Labs (all labs ordered are listed, but only abnormal results are displayed) Labs Reviewed - No data to display  EKG   Radiology No results found.  Procedures Procedures (including critical care time)  Medications Ordered in UC Medications - No data to display  Initial Impression / Assessment and Plan / UC Course  I have reviewed the triage vital signs and the nursing notes.  Pertinent labs & imaging results that were available during my care of the patient were reviewed by me and considered in my medical decision making (see chart for details).     MDM: 1.  Headache disorder-Advised patient to go to [facility] ED now for further evaluation of headache and necessary imaging.  Patient agreed and verbalized understanding of these instructions and this plan of care today.  Patient discharged,  hemodynamically stable. Final Clinical Impressions(s) / UC Diagnoses   Final diagnoses:  Headache disorder     Discharge Instructions      Advised patient to go to [facility] ED now for further evaluation of headache and necessary imaging.  Patient agreed and verbalized understanding of these instructions and this plan of care today.     ED Prescriptions   None    PDMP not reviewed this encounter.   Trevor Iha, FNP 10/17/21 1556

## 2021-10-17 NOTE — Discharge Instructions (Addendum)
Advised patient to go to [facility] ED now for further evaluation of headache and necessary imaging.  Patient agreed and verbalized understanding of these instructions and this plan of care today.

## 2021-10-17 NOTE — ED Notes (Signed)
Patient is being discharged from the Urgent Care and sent to the Emergency Department via POV. Per M.Ragan, FNP patient is in need of higher level of care due to ongoing HA & loss of vision yesterday . Patient is aware and verbalizes understanding of plan of care.  Vitals:   10/17/21 1534  BP: 135/90  Pulse: 81  Resp: 17  Temp: 98.3 F (36.8 C)  SpO2: 94%   Report called to [facility] ED- Asher Muir charge RN

## 2021-10-17 NOTE — ED Triage Notes (Signed)
Pt states he hit his head on a heavy crate on 10/13. Pt has had ongoing migraine since head injury. Yesterday pt reports he lost his vision for 3-5 minutes yesterday (denies LOC). Pt went to UC today and was sent here for further evaluation and CT scan.

## 2021-10-17 NOTE — ED Provider Notes (Signed)
[facility] EMERGENCY DEPARTMENT Provider Note   CSN: [id number] Arrival date & time: 10/17/21  1625     History Chief Complaint  Patient presents with   Head Injury    [name] is a 45 y.o. male history of COVID, depression, here presenting with head injury.  Patient states that he was moving his couch and the couch fell and hit his head on 10/13.  He states that he had a laceration he was feeling fine.  He had mild headache since then.  He states that 2 days ago, he had another fall and landed on his left elbow.  He did not have any head injury at that time.  He states that since then he has been having photophobia.  He states that yesterday he had an episode where he had a brief loss of vision.  He states that his vision is normal now.  He has been having photophobia since then.  He went to urgent care was sent here for further evaluation.  Denies any trouble speaking or vomiting.  The history is provided by the patient.      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 No pertinent surgical history.     Family  History  Problem Relation Age of Onset   Healthy Mother    Healthy Father     Social History   Tobacco Use   Smoking status: Every Day    Packs/day: 0.50    Years: 28.00    Pack years: 14.00    Types: Cigarettes   Smokeless tobacco: Never  Vaping Use   Vaping Use: Former  Substance Use Topics   Alcohol use: No   Drug use: No    Home Medications Prior to Admission medications   Medication Sig Start Date End Date Taking? Authorizing Provider  albuterol (VENTOLIN HFA) 108 (90 Base) MCG/ACT inhaler Inhale into the lungs. 04/02/20   [provider]  ALPRAZolam Prudy Feeler) 1 MG tablet Take by mouth. 05/20/21   [provider]  cetirizine (ZYRTEC) 10 MG tablet Take 1 tablet (10 mg total) by mouth daily. Patient not taking: Reported on 05/08/2021 05/04/18   Lurene Shadow, PA-C  clobetasol (TEMOVATE) 0.05 % external solution Apply topically 2 (two) times daily. 07/07/21   [provider]  diclofenac (VOLTAREN) 75 MG EC tablet Take 1 tablet (75 mg total) by mouth 2 (two) times daily. Patient not taking: Reported on 05/08/2021 04/19/19   Peyton Najjar, MD  fluconazole (DIFLUCAN) 200 MG tablet Take 1 tablet (200 mg total) by mouth daily. Patient not taking: Reported on 05/08/2021 09/29/20  Doroteo Glassman, Erin O, PA-C  fluticasone (FLOVENT HFA) 44 MCG/ACT inhaler Inhale 2 puffs into the lungs 2 (two) times daily. 03/18/21   [provider]  gabapentin (NEURONTIN) 600 MG tablet Take 600 mg by mouth as needed. Patient not taking: Reported on 05/08/2021    [provider]  HYDROcodone-acetaminophen (NORCO) 10-325 MG tablet Take 1 tablet by mouth every 6 (six) hours as needed. Patient not taking: Reported on 10/17/2021    [provider]  hydrOXYzine (ATARAX/VISTARIL) 25 MG tablet Take 25 mg by mouth 3 (three) times daily. 06/18/21   [provider]  ibuprofen (ADVIL) 800 MG tablet Take 800 mg by mouth 3 (three) times daily. 09/08/20   [provider]  latanoprost (XALATAN) 0.005 % ophthalmic solution Place 1 drop into both eyes at bedtime. 08/01/19   [provider]  meloxicam (MOBIC) 15 MG tablet TAKE 1 TABLET BY MOUTH EVERY DAY WITH FOOD FOR 2 WEEKS AS NEEDED FOR PAIN Patient not taking: Reported on 10/17/2021 06/12/21   [provider]  methocarbamol (ROBAXIN) 500 MG tablet Take 1 or 2 pills in the morning, 1 or 2 in the afternoon, and 2 at bedtime for muscle relaxant Patient not taking: Reported on 05/08/2021 04/19/19   Peyton Najjar, MD  mirtazapine (REMERON) 15 MG tablet Take 1 tablet (15 mg total) by mouth at bedtime. Patient not taking: Reported on 05/08/2021 03/03/18   Monica Becton, MD  predniSONE (DELTASONE) 20 MG tablet 3 tabs po day one, then 2 po daily x 4 days Patient not taking: Reported on 05/08/2021 05/04/18   Lurene Shadow, PA-C  sildenafil (REVATIO) 20 MG tablet SMARTSIG:3-5 Tablet(s) By Mouth 07/02/21   [provider]  timolol (TIMOPTIC) 0.5 % ophthalmic solution Place 1 drop into both eyes every morning. 08/01/19   [provider]  traMADol-acetaminophen (ULTRACET) 37.5-325 MG tablet Take 1 tablet by mouth every 6 (six) hours as needed. Patient not taking: Reported on 05/08/2021    [provider]  varenicline (CHANTIX) 0.5 MG tablet Take by mouth. 10/10/21   [provider]    Allergies    Other  Review of Systems   Review of Systems  Eyes:  Positive for photophobia.  Neurological:  Positive for headaches.  All other systems reviewed and are negative.  Physical Exam Updated Vital Signs BP 132/82   Pulse 80   Temp 97.9 F (36.6 C) (Oral)   Resp 20   Ht 6\' 2"  (1.88 m)   Wt 83.5 kg   SpO2 98%   BMI 23.62 kg/m   Physical Exam Vitals and nursing note reviewed.  HENT:     Head: Normocephalic.     Comments: Left frontal hematoma.  Patient has a healing small laceration left forehead    Nose: Nose normal.  Eyes:     Extraocular  Movements: Extraocular movements intact.     Pupils: Pupils are equal, round, and reactive to light.  Cardiovascular:     Rate and Rhythm: Normal rate and regular rhythm.     Pulses: Normal pulses.     Heart sounds: Normal heart sounds.  Pulmonary:     Effort: Pulmonary effort is normal.     Breath sounds: Normal breath sounds.  Abdominal:     General: Abdomen is flat.     Palpations: Abdomen is soft.  Musculoskeletal:        General: Normal range of motion.     Cervical back: Normal range of motion and  neck supple.  Skin:    General: Skin is warm.  Neurological:     General: No focal deficit present.     Mental Status: He is alert and oriented to person, place, and time.  Psychiatric:        Mood and Affect: Mood normal.        Behavior: Behavior normal.    ED Results / Procedures / Treatments   Labs (all labs ordered are listed, but only abnormal results are displayed) Labs Reviewed  CBC WITH DIFFERENTIAL/PLATELET - Abnormal; Notable for the following components:      Result Value   RBC 4.02 (*)    MCH 34.3 (*)    All other components within normal limits  BASIC METABOLIC PANEL - Abnormal; Notable for the following components:   Calcium 8.8 (*)    Anion gap 4 (*)    All other components within normal limits    EKG None  Radiology CT HEAD WO CONTRAST ( )  Result Date: 10/17/2021 CLINICAL DATA:  Headache. The patient had head trauma on 10/08/2021. EXAM: CT HEAD WITHOUT CONTRAST TECHNIQUE: Contiguous axial images were obtained from the base of the skull through the vertex without intravenous contrast. COMPARISON:  CT head dated 09/29/2020. FINDINGS: Brain: No evidence of acute infarction, hemorrhage, hydrocephalus, extra-axial collection or mass lesion/mass effect. Vascular: No hyperdense vessel or unexpected calcification. Skull: Normal. Negative for fracture or focal lesion. Sinuses/Orbits: No acute finding. Other: None. IMPRESSION: No acute intracranial process.  Electronically Signed   By: Romona Curls [name]   On: 10/17/2021 17:04    Procedures Procedures   Medications Ordered in ED Medications  sodium chloride 0.9 % bolus 1,000 mL (1,000 mLs Intravenous New Bag/Given 10/17/21 1734)  metoCLOPramide (REGLAN) injection 10 mg (10 mg Intravenous Given 10/17/21 1741)  diphenhydrAMINE (BENADRYL) injection 12.5 mg (12.5 mg Intravenous Given 10/17/21 1739)  ketorolac (TORADOL) 30 MG/ML injection 30 mg (30 mg Intravenous Given 10/17/21 1741)  dexamethasone (DECADRON) injection 4 mg (4 mg Intravenous Given 10/17/21 1743)    ED Course  I have reviewed the triage vital signs and the nursing notes.  Pertinent labs & imaging results that were available during my care of the patient were reviewed by me and considered in my medical decision making (see chart for details).    MDM Rules/Calculators/A&P                          [name] is a 45 y.o. male here with headache after head injury.  I think likely postconcussive syndrome.  We will get CT head to rule out bleed.  Will give migraine cocktail   6:59 PM Headache resolved now after migraine cocktail. CT head unremarkable.  Labs unremarkable   Final Clinical Impression(s) / ED Diagnoses Final diagnoses:  None    Rx / DC Orders ED Discharge Orders     None        Charlynne Pander, MD 10/17/21 1859

## 2021-10-17 NOTE — ED Triage Notes (Signed)
Pt was moving furniture on 10/08/21 Pt hit his head while moving a sofa & had a small gash - did not go to ED Headache started on 10/09/21 No relief w/ aleve, Bayer, tylenol, advil, excedrin  OTC today 3 Bayer ASA at 1330 Pt lost vision in both eyes yesterday - resolved w/in 3 minutes No further loss of vision

## 2021-10-17 NOTE — Discharge Instructions (Signed)
You likely have postconcussive syndrome  Take Tylenol or Motrin for headache.  Avoid repeat head injury or fall  Expect to be dizzy and having headaches or blurry vision for several days  Return to ER if you have worse headache, vomiting, blurry vision, photophobia

## 2022-01-24 IMAGING — CT CT HEAD W/O CM
4 series · 17 of 47 positions shown, 19 images · non-contrast
Comparison: CT head dated 09/29/2020.

CLINICAL DATA: Headache. The patient had head trauma on 10/08/2021.

EXAM:
CT HEAD WITHOUT CONTRAST
TECHNIQUE: Contiguous axial images were obtained from the base of the skull
through the vertex without intravenous contrast.

[Series 3: head without · axial · non-contrast · 0.46mm/px · z∈[-82,+44]mm · 7 of 35 slices shown, 9 images]
[im 5/35  brain]
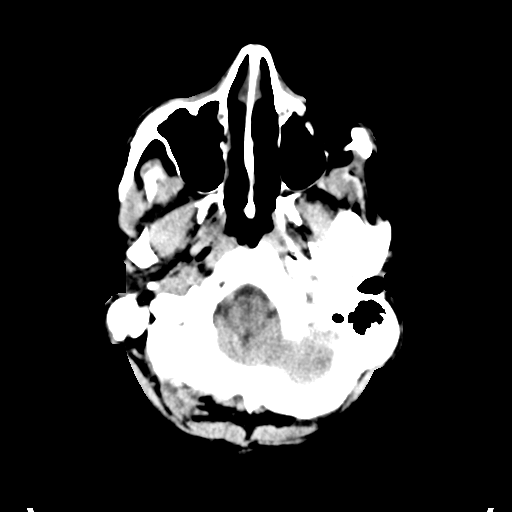
[im 5/35  bone]
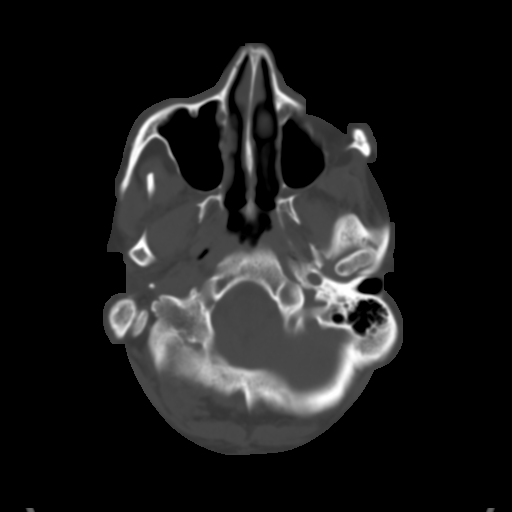
[im 9/35  brain]
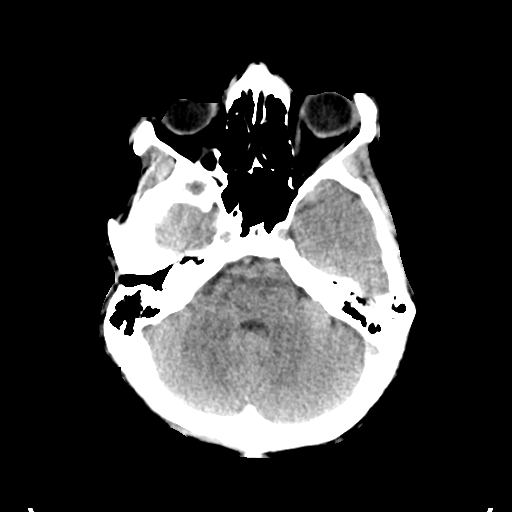
[im 13/35  brain]
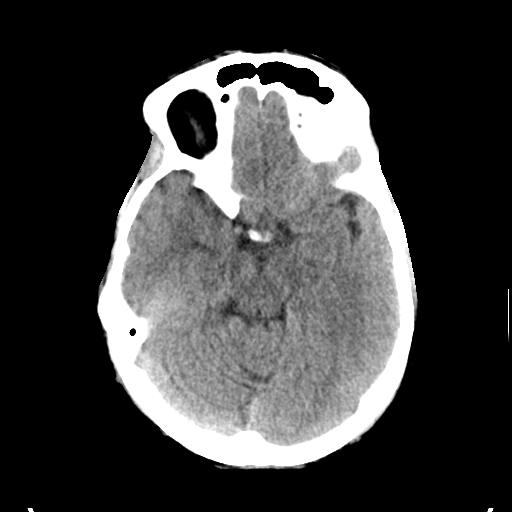
[im 18/35  brain]
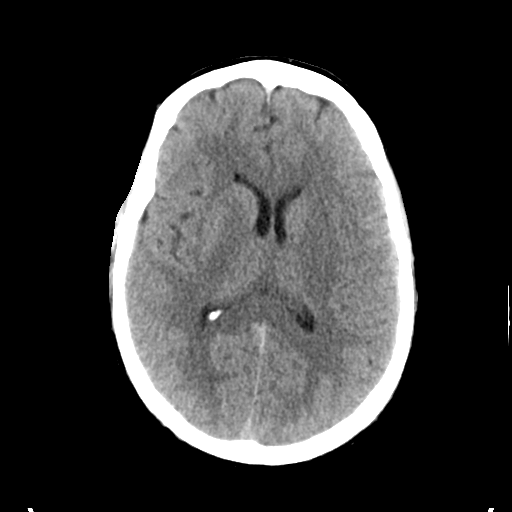
[im 22/35  brain]
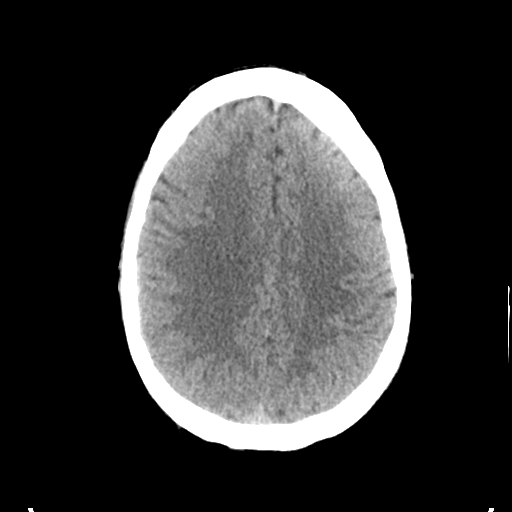
[im 22/35  bone]
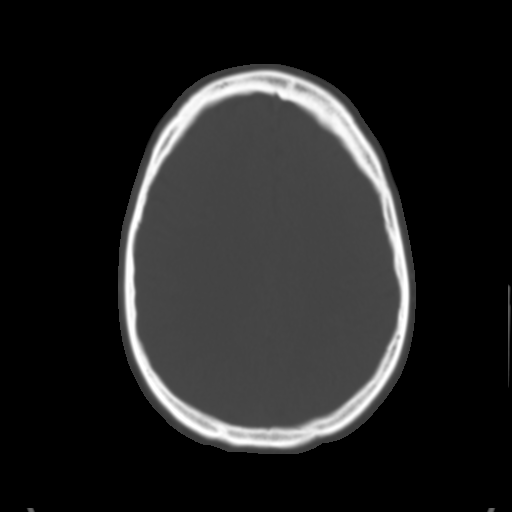
[im 26/35  brain]
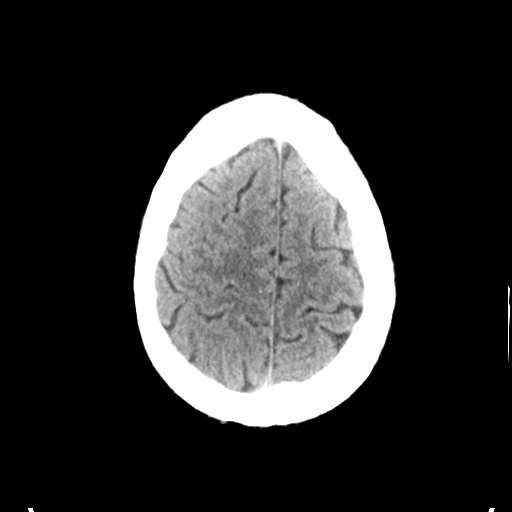
[im 30/35  brain]
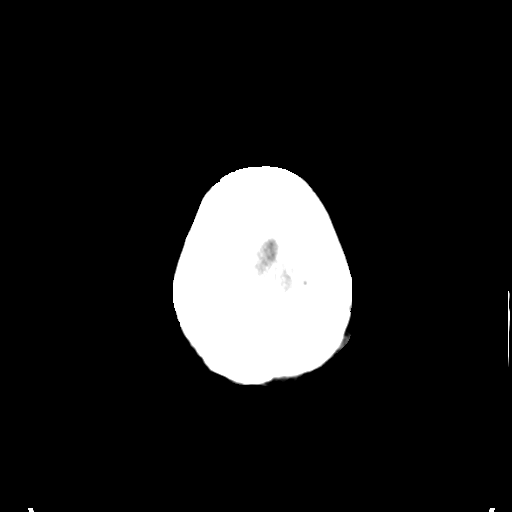

[Series 4: head bone · axial · 0.46mm/px · z∈[-86,-26]mm · 4 of 87 slices shown]
[im 9/87  bone]
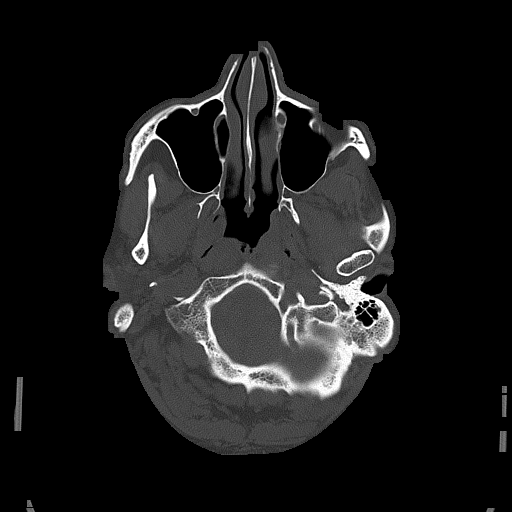
[im 18/87  bone]
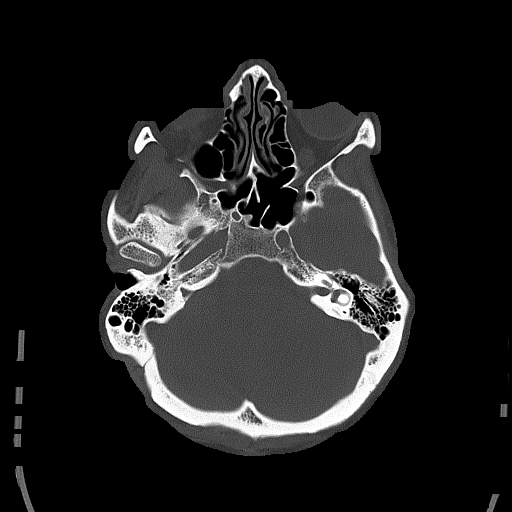
[im 26/87  bone]
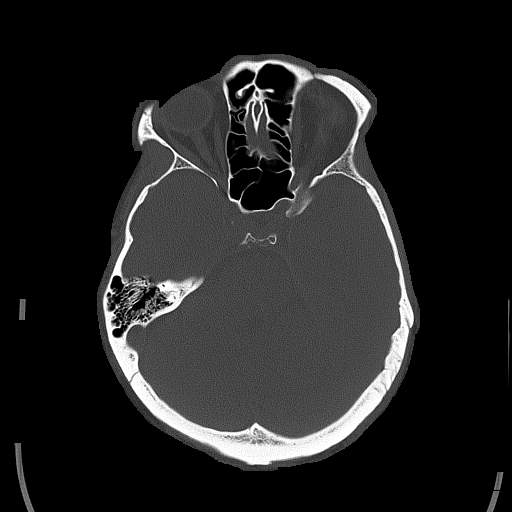
[im 39/87  bone]
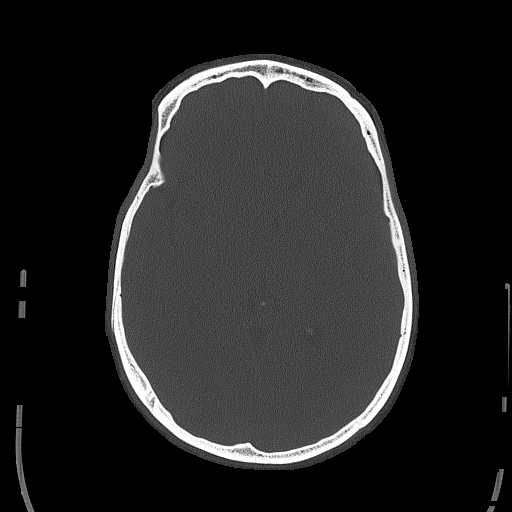

[Series 5: head without cor · coronal · non-contrast · 0.34mm/px · 3 of 70 slices shown]
[im 25/70  brain]
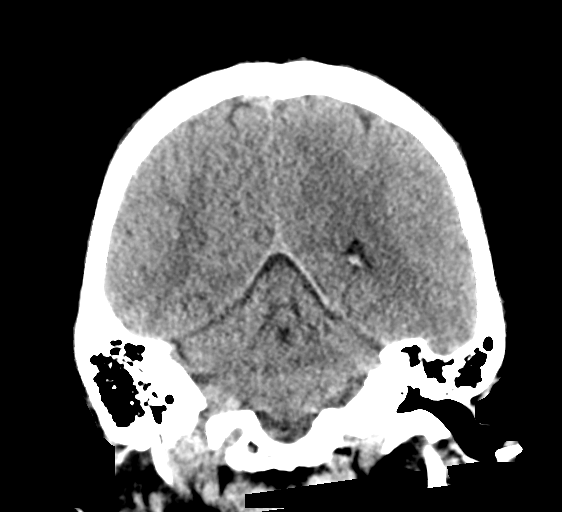
[im 32/70  brain]
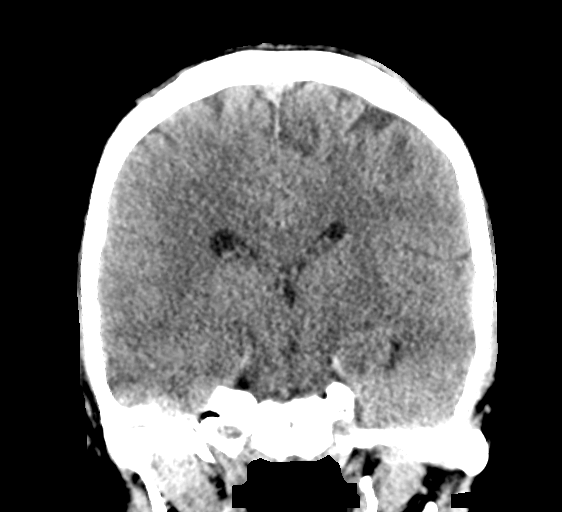
[im 39/70  brain]
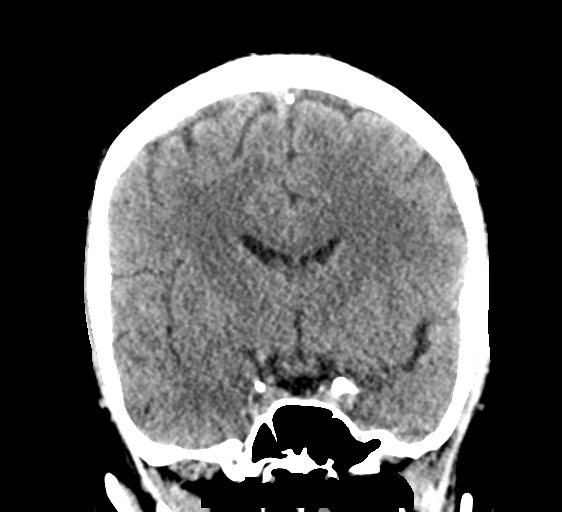

[Series 6: head without sag · sagittal · non-contrast · 0.34mm/px · 3 of 67 slices shown]
[im 23/67  brain]
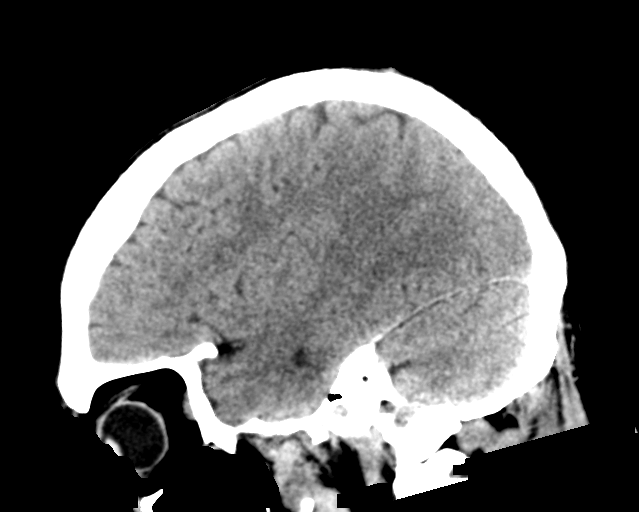
[im 34/67  brain]
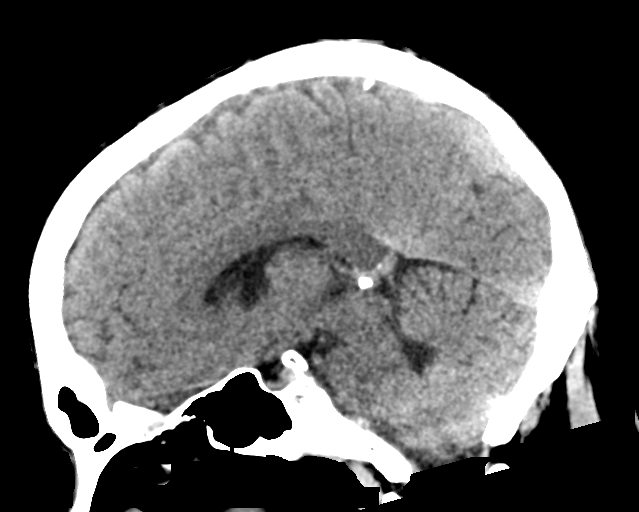
[im 45/67  brain]
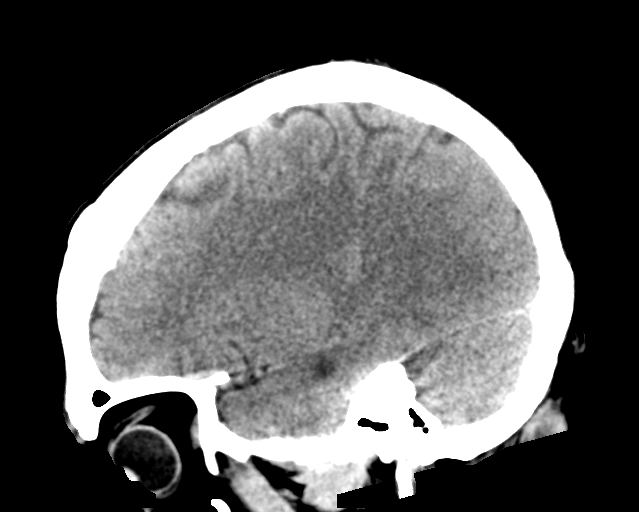

[17 of 47 positions shown; findings below may reference images not displayed]

FINDINGS: Brain: No evidence of acute infarction, hemorrhage, hydrocephalus,
extra-axial collection or mass lesion/mass effect.

Vascular: No hyperdense vessel or unexpected calcification.

Skull: Normal. Negative for fracture or focal lesion.

Sinuses/Orbits: No acute finding.

Other: None.
IMPRESSION: No acute intracranial process.

## 2022-04-07 ENCOUNTER — Encounter: Payer: Self-pay | Admitting: Emergency Medicine

## 2022-04-07 ENCOUNTER — Emergency Department (INDEPENDENT_AMBULATORY_CARE_PROVIDER_SITE_OTHER)
Admission: EM | Admit: 2022-04-07 | Discharge: 2022-04-07 | Disposition: A | Discharge status: Home / Self Care | Payer: Medicaid Other | Source: Home / Self Care

## 2022-04-07 DIAGNOSIS — L237 Allergic contact dermatitis due to plants, except food: Secondary | ICD-10-CM

## 2022-04-07 MED ORDER — METHYLPREDNISOLONE 4 MG PO TBPK: ORAL_TABLET | ORAL | 0 refills | Status: DC

## 2022-04-07 MED ORDER — METHYLPREDNISOLONE SODIUM SUCC 125 MG IJ SOLR
125.0000 mg | Freq: Once | INTRAMUSCULAR | Status: AC
  Administered 2022-04-07: 125 mg via INTRAMUSCULAR

## 2022-04-07 NOTE — Discharge Instructions (Addendum)
Advised/instructed patient to take medication as directed with food to completion.  Advised patient to start Medrol Dosepak tomorrow Thursday, 04/08/2022.  Encouraged patient to increase daily water intake while taking these medications and to change bed linens for the next 3 nights to avoid recontamination. Advised patient if symptoms worsen and/or unresolved please follow-up with PCP or here for further evaluation. ?

## 2022-04-07 NOTE — ED Triage Notes (Signed)
Yard work the last 2 weeks  ?Poison ivy to right hand and arm  ?Started yesterday ?Hydrocortisone cream  ?Only thing that works is steroid shot in past  ? ?

## 2022-04-07 NOTE — ED Provider Notes (Signed)
?[facility] ? ? ? ?CSN: [id number] ?Arrival date & time: 04/07/22  1624 ? ? ?  ? ?History   ?Chief Complaint ?Chief Complaint  ?Patient presents with  ? Poison Ivy  ? ? ?HPI ?[name] is a 46 y.o. male.  ? ?HPI 46 year old male presents with poison ivy of right hand, right arm, and right leg that started yesterday.  PMH significant for ADHD, tobacco dependency, and opioid use disorder. ? ?Past Medical History:  ?Diagnosis Date  ? Anxiety   ? Asthma   ? COVID-19 04/2021  ? Depression   ? Glaucoma   ? ? ?Patient Active Problem List  ? Diagnosis Date Noted  ? ADHD (attention deficit hyperactivity disorder) 09/27/2020  ? Encounter for long-term (current) use of other medications 09/27/2020  ? Mild intermittent asthma 04/02/2020  ? Angular cheilitis with candidiasis 02/05/2020  ? Neck pain 12/04/2019  ? Opioid dependence on agonist therapy (HCC) 12/04/2019  ? Opioid use disorder 09/27/2019  ? Syncope 03/27/2019  ? Smoker 03/03/2018  ? Depression with anxiety 02/03/2018  ? Pulmonary nodules 02/03/2018  ? Chest wall pain 01/20/2018  ? Anxiety 08/27/2011  ? Decreased appetite 08/27/2011  ? Insomnia 08/27/2011  ? Rash 08/27/2011  ? Restless leg 08/27/2011  ? Gastroenteritis, non-infectious 06/08/2011  ? ASTHMA 05/20/2011  ? Unspecified asthma, uncomplicated 05/20/2011  ? ? ?History reviewed. No pertinent surgical history. ? ? ? ? ?Home Medications   ? ?Prior to Admission medications   ?Medication Sig Start Date End Date Taking? Authorizing Provider  ?methylPREDNISolone (MEDROL DOSEPAK) 4 MG TBPK tablet Take as directed. 04/07/22  Yes Trevor Iha, FNP  ?albuterol (VENTOLIN HFA) 108 (90 Base) MCG/ACT inhaler Inhale into the lungs. ?Patient not taking: Reported on 04/07/2022 04/02/20   [provider]  ?ALPRAZolam Prudy Feeler) 1 MG tablet Take by mouth. 05/20/21   [provider]  ?cetirizine (ZYRTEC) 10 MG tablet Take 1 tablet (10 mg total) by mouth daily. ?Patient not taking: Reported on 05/08/2021  05/04/18   Lurene Shadow, PA-C  ?clobetasol (TEMOVATE) 0.05 % external solution Apply topically 2 (two) times daily. 07/07/21   [provider]  ?diclofenac (VOLTAREN) 75 MG EC tablet Take 1 tablet (75 mg total) by mouth 2 (two) times daily. 04/19/19   Peyton Najjar, MD  ?fluconazole (DIFLUCAN) 200 MG tablet Take 1 tablet (200 mg total) by mouth daily. ?Patient not taking: Reported on 05/08/2021 09/29/20   Lurene Shadow, PA-C  ?fluticasone (FLOVENT HFA) 44 MCG/ACT inhaler Inhale 2 puffs into the lungs 2 (two) times daily. ?Patient not taking: Reported on 04/07/2022 03/18/21   [provider]  ?gabapentin (NEURONTIN) 600 MG tablet Take 600 mg by mouth as needed. ?Patient not taking: Reported on 05/08/2021    [provider]  ?HYDROcodone-acetaminophen (NORCO) 10-325 MG tablet Take 1 tablet by mouth every 6 (six) hours as needed. ?Patient not taking: Reported on 10/17/2021    [provider]  ?hydrOXYzine (ATARAX/VISTARIL) 25 MG tablet Take 25 mg by mouth 3 (three) times daily. 06/18/21   [provider]  ?ibuprofen (ADVIL) 800 MG tablet Take 800 mg by mouth 3 (three) times daily. 09/08/20   [provider]  ?latanoprost (XALATAN) 0.005 % ophthalmic solution Place 1 drop into both eyes at bedtime. 08/01/19   [provider]  ?meloxicam (MOBIC) 15 MG tablet TAKE 1 TABLET BY MOUTH EVERY DAY WITH FOOD FOR 2 WEEKS AS NEEDED FOR PAIN ?Patient not taking: Reported on 10/17/2021 06/12/21  [provider]  ?methocarbamol (ROBAXIN) 500 MG tablet Take 1 or 2 pills in the morning, 1 or 2 in the afternoon, and 2 at bedtime for muscle relaxant ?Patient not taking: Reported on 05/08/2021 04/19/19   Peyton Najjar, MD  ?mirtazapine (REMERON) 15 MG tablet Take 1 tablet (15 mg total) by mouth at bedtime. ?Patient not taking: Reported on 05/08/2021 03/03/18   Monica Becton, MD  ?predniSONE (DELTASONE) 20 MG tablet 3 tabs po day one, then 2 po daily x 4 days ?Patient  not taking: Reported on 05/08/2021 05/04/18   Lurene Shadow, PA-C  ?sildenafil (REVATIO) 20 MG tablet SMARTSIG:3-5 Tablet(s) By Mouth 07/02/21   [provider]  ?SPIRIVA HANDIHALER 18 MCG inhalation capsule 1 capsule daily. 03/05/22   [provider]  ?timolol (TIMOPTIC) 0.5 % ophthalmic solution Place 1 drop into both eyes every morning. 08/01/19   [provider]  ?traMADol-acetaminophen (ULTRACET) 37.5-325 MG tablet Take 1 tablet by mouth every 6 (six) hours as needed. ?Patient not taking: Reported on 05/08/2021    [provider]  ?varenicline (CHANTIX) 0.5 MG tablet Take by mouth. 10/10/21   [provider]  ? ? ?Family History ?Family History  ?Problem Relation Age of Onset  ? Healthy Mother   ? Healthy Father   ? ? ?Social History ?Social History  ? ?Tobacco Use  ? Smoking status: Every Day  ?  Packs/day: 0.50  ?  Years: 28.00  ?  Pack years: 14.00  ?  Types: Cigarettes  ? Smokeless tobacco: Never  ?Vaping Use  ? Vaping Use: Former  ?Substance Use Topics  ? Alcohol use: No  ? Drug use: No  ? ? ? ?Allergies   ?Other ? ? ?Review of Systems ?Review of Systems  ?Skin:  Positive for rash.  ?All other systems reviewed and are negative. ? ? ?Physical Exam ?Triage Vital Signs ?ED Triage Vitals  ?Enc Vitals Group  ?   BP   ?   Pulse   ?   Resp   ?   Temp   ?   Temp src   ?   SpO2   ?   Weight   ?   Height   ?   Head Circumference   ?   Peak Flow   ?   Pain Score   ?   Pain Loc   ?   Pain Edu?   ?   Excl. in GC?   ? ?No data found. ? ?Updated Vital Signs ?BP 126/87 (BP Location: Left Arm)   Pulse 78   Temp 98.4 ?F (36.9 ?C) (Oral)   Resp 15   Ht 6\' 2"  (1.88 m)   Wt 186 lb (84.4 kg)   SpO2 98%   BMI 23.88 kg/m?  ? ? ?Physical Exam ?Vitals and nursing note reviewed.  ?Constitutional:   ?   Appearance: Normal appearance. He is normal weight.  ?HENT:  ?   Head: Normocephalic and atraumatic.  ?   Mouth/Throat:  ?   Mouth: Mucous membranes are moist.  ?   Pharynx: Oropharynx is  clear.  ?Eyes:  ?   Extraocular Movements: Extraocular movements intact.  ?   Conjunctiva/sclera: Conjunctivae normal.  ?   Pupils: Pupils are equal, round, and reactive to light.  ?Cardiovascular:  ?   Rate and Rhythm: Normal rate and regular rhythm.  ?   Pulses: Normal pulses.  ?   Heart sounds: Normal heart sounds.  ?Pulmonary:  ?  Effort: Pulmonary effort is normal.  ?   Breath sounds: Normal breath sounds. No wheezing, rhonchi or rales.  ?Musculoskeletal:  ?   Cervical back: Normal range of motion and neck supple.  ?Skin: ?   General: Skin is warm and dry.  ?   Comments: Right hand (dorsum): Right lower arm (volar aspect), right lower leg (anterior/posterior aspects): Pruritic erythematous maculopapular eruption with grouped linear vesicular lesions noted  ?Neurological:  ?   General: No focal deficit present.  ?   Mental Status: He is alert and oriented to person, place, and time.  ? ? ? ?UC Treatments / Results  ?Labs ?(all labs ordered are listed, but only abnormal results are displayed) ?Labs Reviewed - No data to display ? ?EKG ? ? ?Radiology ?No results found. ? ?Procedures ?Procedures (including critical care time) ? ?Medications Ordered in UC ?Medications  ?methylPREDNISolone sodium succinate (SOLU-MEDROL) 125 mg/2 mL injection 125 mg (125 mg Intramuscular Given 04/07/22 1720)  ? ? ?Initial Impression / Assessment and Plan / UC Course  ?I have reviewed the triage vital signs and the nursing notes. ? ?Pertinent labs & imaging results that were available during my care of the patient were reviewed by me and considered in my medical decision making (see chart for details). ? ?  ? ?MDM: 1.  Poison ivy dermatitis-IM Solu-Medrol 125 mg given once in clinic, Rx'd Medrol Dosepak. Advised/instructed patient to take medication as directed with food to completion.  Advised patient to start Medrol Dosepak tomorrow Thursday, 04/08/2022.  Encouraged patient to increase daily water intake while taking these medications  and to change bed linens for the next 3 nights to avoid recontamination. Advised patient if symptoms worsen and/or unresolved please follow-up with PCP or here for further evaluation.  Patient discharged home,

## 2022-06-12 ENCOUNTER — Other Ambulatory Visit: Payer: Self-pay

## 2022-06-12 ENCOUNTER — Emergency Department
Admission: EM | Admit: 2022-06-12 | Discharge: 2022-06-12 | Disposition: A | Discharge status: Home / Self Care | Payer: Medicaid Other | Source: Home / Self Care

## 2022-06-12 DIAGNOSIS — L237 Allergic contact dermatitis due to plants, except food: Secondary | ICD-10-CM

## 2022-06-12 MED ORDER — METHYLPREDNISOLONE SODIUM SUCC 125 MG IJ SOLR
125.0000 mg | Freq: Once | INTRAMUSCULAR | Status: AC
  Administered 2022-06-12: 125 mg via INTRAMUSCULAR

## 2022-06-12 MED ORDER — PREDNISONE 10 MG (21) PO TBPK: ORAL_TABLET | Freq: Every day | ORAL | 0 refills | Status: DC

## 2022-06-12 NOTE — ED Triage Notes (Signed)
Pt presents to Urgent Care with c/o rash to both legs and right arm x 3-4 days. Reports working outside and states he has poison ivy.

## 2022-06-12 NOTE — ED Provider Notes (Signed)
[name]    CSN: [id number] Arrival date & time: 06/12/22  1541      History   Chief Complaint Chief Complaint  Patient presents with   Rash    HPI [name] is a 46 y.o. male.   Pt presents with a rash to bilateral lower extremities and arms after working out in the yard.  He reports it is very itchy.  He has had poison ivy several times, today's sx are similar.  He reports the steroid shot in the clinic with a steroid dosepak started on the next day is the only thing that has helped him in the past.  He has tried nothing for it.     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 No pertinent surgical history.     Home Medications    Prior to Admission medications   Medication Sig Start Date End Date Taking? Authorizing Provider  predniSONE (STERAPRED UNI-PAK 21 TAB) 10 MG (21) TBPK tablet Take by mouth daily. Take 6 tabs by mouth daily  for 2 days, then 5 tabs for 2 days, then 4 tabs for 2 days, then 3 tabs for 2 days, 2 tabs for 2 days, then 1 tab by mouth daily for 2 days 06/12/22  Yes Ward, Shanda Bumps Z, PA-C   albuterol (VENTOLIN HFA) 108 (90 Base) MCG/ACT inhaler Inhale into the lungs. Patient not taking: Reported on 04/07/2022 04/02/20   [provider]  ALPRAZolam Prudy Feeler) 1 MG tablet Take by mouth. 05/20/21   [provider]  cetirizine (ZYRTEC) 10 MG tablet Take 1 tablet (10 mg total) by mouth daily. Patient not taking: Reported on 05/08/2021 05/04/18   Lurene Shadow, PA-C  clobetasol (TEMOVATE) 0.05 % external solution Apply topically 2 (two) times daily. 07/07/21   [provider]  diclofenac (VOLTAREN) 75 MG EC tablet Take 1 tablet (75 mg total) by mouth 2 (two) times daily. 04/19/19   Peyton Najjar, MD  fluconazole (DIFLUCAN) 200 MG tablet Take 1 tablet (200 mg total) by mouth daily. Patient not taking: Reported on 05/08/2021 09/29/20   Lurene Shadow, PA-C  fluticasone (FLOVENT HFA) 44 MCG/ACT inhaler Inhale 2 puffs into the lungs 2 (two) times daily. Patient not taking: Reported on 04/07/2022 03/18/21   [provider]  gabapentin (NEURONTIN) 600 MG tablet Take 600 mg by mouth as needed. Patient not taking: Reported on 05/08/2021    [provider]  HYDROcodone-acetaminophen (NORCO) 10-325 MG tablet Take 1 tablet by mouth every 6 (six) hours as needed. Patient not taking: Reported on 10/17/2021    [provider]  hydrOXYzine (  ATARAX/VISTARIL) 25 MG tablet Take 25 mg by mouth 3 (three) times daily. 06/18/21   [provider]  ibuprofen (ADVIL) 800 MG tablet Take 800 mg by mouth 3 (three) times daily. 09/08/20   [provider]  latanoprost (XALATAN) 0.005 % ophthalmic solution Place 1 drop into both eyes at bedtime. 08/01/19   [provider]  meloxicam (MOBIC) 15 MG tablet TAKE 1 TABLET BY MOUTH EVERY DAY WITH FOOD FOR 2 WEEKS AS NEEDED FOR PAIN Patient not taking: Reported on 10/17/2021 06/12/21   [provider]  methocarbamol (ROBAXIN) 500 MG tablet Take 1 or 2 pills in the morning, 1 or 2 in the afternoon, and 2  at bedtime for muscle relaxant Patient not taking: Reported on 05/08/2021 04/19/19   Peyton Najjar, MD  methylPREDNISolone (MEDROL DOSEPAK) 4 MG TBPK tablet Take as directed. 04/07/22   Trevor Iha, FNP  mirtazapine (REMERON) 15 MG tablet Take 1 tablet (15 mg total) by mouth at bedtime. Patient not taking: Reported on 05/08/2021 03/03/18   Monica Becton, MD  predniSONE (DELTASONE) 20 MG tablet 3 tabs po day one, then 2 po daily x 4 days Patient not taking: Reported on 05/08/2021 05/04/18   Lurene Shadow, PA-C  sildenafil (REVATIO) 20 MG tablet SMARTSIG:3-5 Tablet(s) By Mouth 07/02/21   [provider]  SPIRIVA HANDIHALER 18 MCG inhalation capsule 1 capsule daily. 03/05/22   [provider]  timolol (TIMOPTIC) 0.5 % ophthalmic solution Place 1 drop into both eyes every morning. 08/01/19   [provider]  traMADol-acetaminophen (ULTRACET) 37.5-325 MG tablet Take 1 tablet by mouth every 6 (six) hours as needed. Patient not taking: Reported on 05/08/2021    [provider]  varenicline (CHANTIX) 0.5 MG tablet Take by mouth. 10/10/21   [provider]    Family History Family History  Problem Relation Age of Onset   Healthy Mother    Healthy Father     Social History Social History   Tobacco Use   Smoking status: Every Day    Packs/day: 0.50    Years: 28.00    Total pack years: 14.00    Types: Cigarettes   Smokeless tobacco: Never  Vaping Use   Vaping Use: Former  Substance Use Topics   Alcohol use: No   Drug use: No     Allergies   Other   Review of Systems Review of Systems  Constitutional:  Negative for chills and fever.  HENT:  Negative for ear pain and sore throat.   Eyes:  Negative for pain and visual disturbance.  Respiratory:  Negative for cough and shortness of breath.   Cardiovascular:  Negative for chest pain and palpitations.  Gastrointestinal:  Negative for abdominal pain and vomiting.  Genitourinary:  Negative  for dysuria and hematuria.  Musculoskeletal:  Negative for arthralgias and back pain.  Skin:  Positive for rash. Negative for color change.  Neurological:  Negative for seizures and syncope.  All other systems reviewed and are negative.    Physical Exam Triage Vital Signs ED Triage Vitals  Enc Vitals Group     BP 06/12/22 1553 119/77     Pulse Rate 06/12/22 1553 76     Resp 06/12/22 1553 20     Temp 06/12/22 1553 98.5 F (36.9 C)     Temp Source 06/12/22 1553 Oral     SpO2 06/12/22 1553 96 %     Weight 06/12/22 1551 189 lb (85.7 kg)  Height 06/12/22 1551 6\' 2"  (1.88 m)     Head Circumference --      Peak Flow --      Pain Score 06/12/22 1551 0     Pain Loc --      Pain Edu? --      Excl. in GC? --    No data found.  Updated Vital Signs BP 119/77 (BP Location: Right Arm)   Pulse 76   Temp 98.5 F (36.9 C) (Oral)   Resp 20   Ht 6\' 2"  (1.88 m)   Wt 189 lb (85.7 kg)   SpO2 96%   BMI 24.27 kg/m   Visual Acuity Right Eye Distance:   Left Eye Distance:   Bilateral Distance:    Right Eye Near:   Left Eye Near:    Bilateral Near:     Physical Exam Vitals and nursing note reviewed.  Constitutional:      General: He is not in acute distress.    Appearance: He is well-developed.  HENT:     Head: Normocephalic and atraumatic.  Eyes:     Conjunctiva/sclera: Conjunctivae normal.  Cardiovascular:     Rate and Rhythm: Normal rate and regular rhythm.     Heart sounds: No murmur heard. Pulmonary:     Effort: Pulmonary effort is normal. No respiratory distress.     Breath sounds: Normal breath sounds.  Abdominal:     Palpations: Abdomen is soft.     Tenderness: There is no abdominal tenderness.  Musculoskeletal:        General: No swelling.     Cervical back: Neck supple.  Skin:    General: Skin is warm and dry.     Capillary Refill: Capillary refill takes less than 2 seconds.     Comments: Diffuse vesicular rash to bilateral lower extremities and right  forearm.   Neurological:     Mental Status: He is alert.  Psychiatric:        Mood and Affect: Mood normal.      UC Treatments / Results  Labs (all labs ordered are listed, but only abnormal results are displayed) Labs Reviewed - No data to display  EKG   Radiology No results found.  Procedures Procedures (including critical care time)  Medications Ordered in UC Medications  methylPREDNISolone sodium succinate (SOLU-MEDROL) 125 mg/2 mL injection 125 mg (has no administration in time range)    Initial Impression / Assessment and Plan / UC Course  I have reviewed the triage vital signs and the nursing notes.  Pertinent labs & imaging results that were available during my care of the patient were reviewed by me and considered in my medical decision making (see chart for details).     Rash consistent with poison ivy. Dosepak prescribed.  Supportive care discussed. Return precautions discussed.  Final Clinical Impressions(s) / UC Diagnoses   Final diagnoses:  Poison ivy dermatitis     Discharge Instructions      Take prednisone as prescribed May take benadryl as needed for itching.  Return if no improvement    ED Prescriptions     Medication Sig Dispense Auth. Provider   predniSONE (STERAPRED UNI-PAK 21 TAB) 10 MG (21) TBPK tablet Take by mouth daily. Take 6 tabs by mouth daily  for 2 days, then 5 tabs for 2 days, then 4 tabs for 2 days, then 3 tabs for 2 days, 2 tabs for 2 days, then 1 tab by mouth daily for 2 days 42 tablet  Ward, Tylene Fantasia, PA-C      PDMP not reviewed this encounter.   Ward, Tylene Fantasia, PA-C 06/12/22 1606

## 2022-06-12 NOTE — Discharge Instructions (Signed)
Take prednisone as prescribed May take benadryl as needed for itching.  Return if no improvement

## 2022-06-27 ENCOUNTER — Encounter: Payer: Self-pay | Admitting: Emergency Medicine

## 2022-06-27 ENCOUNTER — Other Ambulatory Visit: Payer: Self-pay

## 2022-06-27 ENCOUNTER — Emergency Department (INDEPENDENT_AMBULATORY_CARE_PROVIDER_SITE_OTHER)
Admission: EM | Admit: 2022-06-27 | Discharge: 2022-06-27 | Disposition: A | Discharge status: Home / Self Care | Payer: Medicaid Other | Source: Home / Self Care

## 2022-06-27 DIAGNOSIS — L237 Allergic contact dermatitis due to plants, except food: Secondary | ICD-10-CM | POA: Diagnosis not present

## 2022-06-27 MED ORDER — METHYLPREDNISOLONE SODIUM SUCC 125 MG IJ SOLR
125.0000 mg | Freq: Once | INTRAMUSCULAR | Status: AC
  Administered 2022-06-27: 125 mg via INTRAMUSCULAR

## 2022-06-27 MED ORDER — METHYLPREDNISOLONE 4 MG PO TBPK: ORAL_TABLET | ORAL | 0 refills | Status: DC

## 2022-06-27 NOTE — ED Triage Notes (Signed)
Patient presents to Urgent Care with complaints of rash since 1 day. Patient reports just buying a house and cleaning out the brush around the house. The rash is on his bilateral forearms, a small place on face.

## 2022-06-27 NOTE — ED Provider Notes (Signed)
[name]    CSN: [id number] Arrival date & time: 06/27/22  1501      History   Chief Complaint Chief Complaint  Patient presents with   Rash    HPI [name] is a 46 y.o. male.   HPI Very pleasant 46 year old male presents with rash for 1 day.  Reports bilateral rash of forearms and small placed on face.  PMH significant for mild intermittent asthma current cigarette smoker, and ADHD.  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 No pertinent surgical history.     Home Medications    Prior to Admission medications   Medication Sig Start Date End Date Taking? Authorizing Provider  ALPRAZolam Prudy Feeler) 1 MG tablet Take by mouth. 05/20/21  Yes [provider]  clobetasol (TEMOVATE) 0.05 % external solution Apply topically 2 (two) times daily. 07/07/21  Yes [provider]  diclofenac (VOLTAREN) 75 MG EC tablet Take 1 tablet (75 mg total) by mouth 2 (two) times daily. 04/19/19  Yes Peyton Najjar, MD  latanoprost (XALATAN) 0.005 % ophthalmic solution Place 1 drop into both eyes at bedtime. 08/01/19  Yes  [provider]  methylPREDNISolone (MEDROL DOSEPAK) 4 MG TBPK tablet Take as directed. 06/27/22  Yes Trevor Iha, FNP  albuterol (VENTOLIN HFA) 108 (90 Base) MCG/ACT inhaler Inhale into the lungs. Patient not taking: Reported on 04/07/2022 04/02/20   [provider]  cetirizine (ZYRTEC) 10 MG tablet Take 1 tablet (10 mg total) by mouth daily. Patient not taking: Reported on 05/08/2021 05/04/18   Lurene Shadow, PA-C  fluconazole (DIFLUCAN) 200 MG tablet Take 1 tablet (200 mg total) by mouth daily. Patient not taking: Reported on 05/08/2021 09/29/20   Lurene Shadow, PA-C  fluticasone (FLOVENT HFA) 44 MCG/ACT inhaler Inhale 2 puffs into the lungs 2 (two) times daily. Patient not taking: Reported on 04/07/2022 03/18/21   [provider]  gabapentin (NEURONTIN) 600 MG tablet Take 600 mg by mouth as needed. Patient not taking: Reported on 05/08/2021    [provider]  HYDROcodone-acetaminophen (NORCO) 10-325 MG tablet Take 1 tablet by mouth every 6 (six) hours as needed. Patient not taking: Reported on 10/17/2021    [provider]  hydrOXYzine (ATARAX/VISTARIL) 25 MG tablet Take 25 mg by mouth 3 (three) times daily. 06/18/21   [provider]  ibuprofen (ADVIL) 800 MG tablet Take 800 mg by mouth 3 (three) times daily. 09/08/20   [provider]  meloxicam (MOBIC) 15 MG tablet TAKE 1 TABLET BY MOUTH EVERY DAY WITH FOOD FOR 2 WEEKS AS NEEDED FOR PAIN Patient not taking: Reported on  10/17/2021 06/12/21   [provider]  methocarbamol (ROBAXIN) 500 MG tablet Take 1 or 2 pills in the morning, 1 or 2 in the afternoon, and 2 at bedtime for muscle relaxant Patient not taking: Reported on 05/08/2021 04/19/19   Peyton Najjar, MD  mirtazapine (REMERON) 15 MG tablet Take 1 tablet (15 mg total) by mouth at bedtime. Patient not taking: Reported on 05/08/2021 03/03/18   Monica Becton, MD  sildenafil (REVATIO) 20 MG tablet SMARTSIG:3-5 Tablet(s)  By Mouth 07/02/21   [provider]  SPIRIVA HANDIHALER 18 MCG inhalation capsule 1 capsule daily. 03/05/22   [provider]  timolol (TIMOPTIC) 0.5 % ophthalmic solution Place 1 drop into both eyes every morning. 08/01/19   [provider]  traMADol-acetaminophen (ULTRACET) 37.5-325 MG tablet Take 1 tablet by mouth every 6 (six) hours as needed. Patient not taking: Reported on 05/08/2021    [provider]  varenicline (CHANTIX) 0.5 MG tablet Take by mouth. 10/10/21   [provider]    Family History Family History  Problem Relation Age of Onset   Healthy Mother    Healthy Father     Social History Social History   Tobacco Use   Smoking status: Every Day    Packs/day: 0.50    Years: 28.00    Total pack years: 14.00    Types: Cigarettes   Smokeless tobacco: Never  Vaping Use   Vaping Use: Former  Substance Use Topics   Alcohol use: No   Drug use: No     Allergies   Other   Review of Systems Review of Systems  Skin:  Positive for rash.  All other systems reviewed and are negative.    Physical Exam Triage Vital Signs ED Triage Vitals  Enc Vitals Group     BP      Pulse      Resp      Temp      Temp src      SpO2      Weight      Height      Head Circumference      Peak Flow      Pain Score      Pain Loc      Pain Edu?      Excl. in GC?    No data found.  Updated Vital Signs BP 128/79 (BP Location: Right Arm)   Pulse 83   Temp 98.3 F (36.8 C) (Oral)   Resp 16   SpO2 93%      Physical Exam Vitals and nursing note reviewed.  Constitutional:      General: He is not in acute distress.    Appearance: Normal appearance. He is normal weight. He is not ill-appearing.  HENT:     Head: Normocephalic and atraumatic.     Mouth/Throat:     Mouth: Mucous membranes are moist.     Pharynx: Oropharynx is clear.  Eyes:     Extraocular Movements: Extraocular movements intact.     Conjunctiva/sclera:  Conjunctivae normal.     Pupils: Pupils are equal, round, and reactive to light.  Cardiovascular:     Rate and Rhythm: Normal rate and regular rhythm.     Pulses: Normal pulses.     Heart sounds: Normal heart sounds.  Pulmonary:     Effort: Pulmonary effort is normal.     Breath sounds: Normal breath sounds. No wheezing, rhonchi or rales.  Musculoskeletal:  Cervical back: Normal range of motion and neck supple.  Skin:    General: Skin is warm and dry.     Comments: Face (superior right orbit area), bilateral lower arms (volar aspects): pruritic, erythematous, maculopapular eruption with linear vesicular lesions noted  Neurological:     General: No focal deficit present.     Mental Status: He is alert and oriented to person, place, and time.      UC Treatments / Results  Labs (all labs ordered are listed, but only abnormal results are displayed) Labs Reviewed - No data to display  EKG   Radiology No results found.  Procedures Procedures (including critical care time)  Medications Ordered in UC Medications  methylPREDNISolone sodium succinate (SOLU-MEDROL) 125 mg/2 mL injection 125 mg (125 mg Intramuscular Given 06/27/22 1525)    Initial Impression / Assessment and Plan / UC Course  I have reviewed the triage vital signs and the nursing notes.  Pertinent labs & imaging results that were available during my care of the patient were reviewed by me and considered in my medical decision making (see chart for details).     MDM: 1.  Poison ivy dermatitis-Instructed patient to take medication as directed with food to completion.  Advised patient to start Medrol Dosepak tomorrow morning, Monday, 06/28/2022.  Encouraged patient increase daily water intake while taking this medication. Encouraged patient to change bed linens for the next 3 days to avoid recontamination.  Advised patient if symptoms worsen and/or unresolved please follow-up with PCP or here for further evaluation.   Patient discharged home, hemodynamically stable.  Final Clinical Impressions(s) / UC Diagnoses   Final diagnoses:  Poison ivy dermatitis     Discharge Instructions      Instructed patient to take medication as directed with food to completion.  Advised patient to start Medrol Dosepak tomorrow morning, Monday, 06/28/2022.  Encouraged patient increase daily water intake while taking this medication. Encouraged patient to change bed linens for the next 3 days to avoid recontamination.  Advised patient if symptoms worsen and/or unresolved please follow-up with PCP or here for further evaluation.     ED Prescriptions     Medication Sig Dispense Auth. Provider   methylPREDNISolone (MEDROL DOSEPAK) 4 MG TBPK tablet Take as directed. 1 each Trevor Iha, FNP      PDMP not reviewed this encounter.   Trevor Iha, FNP 06/27/22 1545

## 2022-06-27 NOTE — Discharge Instructions (Addendum)
Instructed patient to take medication as directed with food to completion.  Advised patient to start Medrol Dosepak tomorrow morning, Monday, 06/28/2022.  Encouraged patient increase daily water intake while taking this medication. Encouraged patient to change bed linens for the next 3 days to avoid recontamination.  Advised patient if symptoms worsen and/or unresolved please follow-up with PCP or here for further evaluation.

## 2022-07-02 ENCOUNTER — Emergency Department (INDEPENDENT_AMBULATORY_CARE_PROVIDER_SITE_OTHER)
Admission: EM | Admit: 2022-07-02 | Discharge: 2022-07-02 | Disposition: A | Discharge status: Home / Self Care | Payer: Medicaid Other | Source: Home / Self Care | Attending: Family Medicine | Admitting: Family Medicine

## 2022-07-02 DIAGNOSIS — L089 Local infection of the skin and subcutaneous tissue, unspecified: Secondary | ICD-10-CM | POA: Diagnosis not present

## 2022-07-02 DIAGNOSIS — L255 Unspecified contact dermatitis due to plants, except food: Secondary | ICD-10-CM

## 2022-07-02 MED ORDER — METHYLPREDNISOLONE SODIUM SUCC 125 MG IJ SOLR
125.0000 mg | Freq: Once | INTRAMUSCULAR | Status: AC
  Administered 2022-07-02: 125 mg via INTRAMUSCULAR

## 2022-07-02 MED ORDER — DOXYCYCLINE HYCLATE 100 MG PO CAPS: ORAL_CAPSULE | ORAL | 0 refills | Status: DC

## 2022-07-02 MED ORDER — PREDNISONE 20 MG PO TABS: ORAL_TABLET | ORAL | 0 refills | Status: DC

## 2022-07-02 NOTE — Discharge Instructions (Signed)
Begin prednisone Saturday 07/03/22.  Try taking Zyrtec 10mg  each morning for itching/burning.

## 2022-07-02 NOTE — ED Triage Notes (Signed)
Pt c/o poison ivy rash since last Saturday. Got into some poison ivy 2 Thursdays ago. Was tx with a steroid injection and oral steroids on 7/2. Describes rash as burning sensation. Finished last prednisone today.

## 2022-07-02 NOTE — ED Provider Notes (Signed)
[name]    CSN: [id number] Arrival date & time: 07/02/22  1704      History   Chief Complaint Chief Complaint  Patient presents with   Rash    HPI [name] is a 46 y.o. male.   Patient was treated for rhus dermatitis five days ago.  He was improving, having finished his last Medrol tab today.  He complains of onset of distinct burning sensation and increased redness of the rash on his right arm today.  He denies fevers, chills, and sweats and feels well otherwise.  The history is provided by the patient.    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 No pertinent surgical history.     Home Medications    Prior to Admission medications   Medication Sig Start Date End Date Taking? Authorizing Provider  doxycycline (VIBRAMYCIN) 100 MG capsule Take one cap PO Q12hr with food. 07/02/22  Yes Lattie Haw, MD  predniSONE (DELTASONE) 20 MG tablet Take one tab by mouth twice daily for 4 days, then one daily for 3 days. Take with food. 07/02/22  Yes Lattie Haw, MD  albuterol (VENTOLIN HFA) 108 (90 Base) MCG/ACT  inhaler Inhale into the lungs. Patient not taking: Reported on 04/07/2022 04/02/20   [provider]  ALPRAZolam Prudy Feeler) 1 MG tablet Take by mouth. 05/20/21   [provider]  cetirizine (ZYRTEC) 10 MG tablet Take 1 tablet (10 mg total) by mouth daily. Patient not taking: Reported on 05/08/2021 05/04/18   Lurene Shadow, PA-C  clobetasol (TEMOVATE) 0.05 % external solution Apply topically 2 (two) times daily. 07/07/21   [provider]  diclofenac (VOLTAREN) 75 MG EC tablet Take 1 tablet (75 mg total) by mouth 2 (two) times daily. 04/19/19   Peyton Najjar, MD  fluconazole (DIFLUCAN) 200 MG tablet Take 1 tablet (200 mg total) by mouth daily. Patient not taking: Reported on 05/08/2021 09/29/20   Lurene Shadow, PA-C  fluticasone (FLOVENT HFA) 44 MCG/ACT inhaler Inhale 2 puffs into the lungs 2 (two) times daily. Patient not taking: Reported on 04/07/2022 03/18/21   [provider]  gabapentin (NEURONTIN) 600 MG tablet Take 600 mg by mouth as needed. Patient not taking: Reported on 05/08/2021    [provider]  HYDROcodone-acetaminophen (NORCO) 10-325 MG tablet Take 1 tablet by mouth every 6 (six) hours as needed. Patient not taking: Reported on 10/17/2021    [provider]  hydrOXYzine (ATARAX/VISTARIL) 25 MG tablet Take 25 mg by mouth 3 (three) times daily. 06/18/21   [provider]  ibuprofen (ADVIL) 800 MG tablet Take  800 mg by mouth 3 (three) times daily. 09/08/20   [provider]  latanoprost (XALATAN) 0.005 % ophthalmic solution Place 1 drop into both eyes at bedtime. 08/01/19   [provider]  meloxicam (MOBIC) 15 MG tablet TAKE 1 TABLET BY MOUTH EVERY DAY WITH FOOD FOR 2 WEEKS AS NEEDED FOR PAIN Patient not taking: Reported on 10/17/2021 06/12/21   [provider]  methocarbamol (ROBAXIN) 500 MG tablet Take 1 or 2 pills in the morning, 1 or 2 in the afternoon, and 2 at bedtime for muscle relaxant Patient not  taking: Reported on 05/08/2021 04/19/19   Peyton Najjar, MD  methylPREDNISolone (MEDROL DOSEPAK) 4 MG TBPK tablet Take as directed. 06/27/22   Trevor Iha, FNP  mirtazapine (REMERON) 15 MG tablet Take 1 tablet (15 mg total) by mouth at bedtime. Patient not taking: Reported on 05/08/2021 03/03/18   Monica Becton, MD  sildenafil (REVATIO) 20 MG tablet SMARTSIG:3-5 Tablet(s) By Mouth 07/02/21   [provider]  SPIRIVA HANDIHALER 18 MCG inhalation capsule 1 capsule daily. 03/05/22   [provider]  timolol (TIMOPTIC) 0.5 % ophthalmic solution Place 1 drop into both eyes every morning. 08/01/19   [provider]  traMADol-acetaminophen (ULTRACET) 37.5-325 MG tablet Take 1 tablet by mouth every 6 (six) hours as needed. Patient not taking: Reported on 05/08/2021    [provider]  varenicline (CHANTIX) 0.5 MG tablet Take by mouth. 10/10/21   [provider]    Family History Family History  Problem Relation Age of Onset   Healthy Mother    Healthy Father     Social History Social History   Tobacco Use   Smoking status: Every Day    Packs/day: 0.50    Years: 28.00    Total pack years: 14.00    Types: Cigarettes   Smokeless tobacco: Never  Vaping Use   Vaping Use: Former  Substance Use Topics   Alcohol use: No   Drug use: No     Allergies   Other   Review of Systems Review of Systems  Constitutional: Negative.   Skin:  Positive for color change and rash.  All other systems reviewed and are negative.    Physical Exam Triage Vital Signs ED Triage Vitals  Enc Vitals Group     BP 07/02/22 1710 126/80     Pulse Rate 07/02/22 1710 62     Resp 07/02/22 1710 16     Temp 07/02/22 1710 98.8 F (37.1 C)     Temp Source 07/02/22 1710 Oral     SpO2 07/02/22 1710 98 %     Weight --      Height --      Head Circumference --      Peak Flow --      Pain Score 07/02/22 1709 4     Pain Loc --      Pain Edu? --      Excl. in GC?  --    No data found.  Updated Vital Signs BP 126/80 (BP Location: Right Arm)   Pulse 62   Temp 98.8 F (37.1 C) (Oral)   Resp 16   SpO2 98%   Visual Acuity Right Eye Distance:   Left Eye Distance:   Bilateral Distance:    Right Eye Near:   Left Eye Near:    Bilateral Near:     Physical Exam Vitals and nursing note reviewed.  Constitutional:      General: He  is not in acute distress. HENT:     Head: Normocephalic.     Mouth/Throat:     Pharynx: Oropharynx is clear.  Eyes:     Pupils: Pupils are equal, round, and reactive to light.  Cardiovascular:     Rate and Rhythm: Normal rate.  Pulmonary:     Effort: Pulmonary effort is normal.  Skin:    General: Skin is warm and dry.     Findings: Rash present.     Comments: Bilateral arms have scattered maculopapular erythematous lesions.  Several areas on right forearm are mildly warm and tender to palpation.  No induration or fluctuance.  Neurological:     Mental Status: He is alert.      UC Treatments / Results  Labs (all labs ordered are listed, but only abnormal results are displayed) Labs Reviewed - No data to display  EKG   Radiology No results found.  Procedures Procedures (including critical care time)  Medications Ordered in UC Medications  methylPREDNISolone sodium succinate (SOLU-MEDROL) 125 mg/2 mL injection 125 mg (has no administration in time range)    Initial Impression / Assessment and Plan / UC Course  I have reviewed the triage vital signs and the nursing notes.  Pertinent labs & imaging results that were available during my care of the patient were reviewed by me and considered in my medical decision making (see chart for details).    Administered Solumedrol 125mg  IM, then begin prednisone burst/taper tomorrow. Begin doxycycline for secondary infection.  Followup with dermatologist if not improving 10 days.  Final Clinical Impressions(s) / UC Diagnoses   Final diagnoses:  Rhus  dermatitis  Superficial skin infection     Discharge Instructions      Begin prednisone Saturday 07/03/22.  Try taking Zyrtec 10mg  each morning for itching/burning.    ED Prescriptions     Medication Sig Dispense Auth. Provider   doxycycline (VIBRAMYCIN) 100 MG capsule Take one cap PO Q12hr with food. 10 capsule 09/03/22, MD   predniSONE (DELTASONE) 20 MG tablet Take one tab by mouth twice daily for 4 days, then one daily for 3 days. Take with food. 11 tablet , MD         Lattie Haw, MD 07/03/22 0930

## 2022-08-11 ENCOUNTER — Encounter: Payer: Self-pay | Admitting: Emergency Medicine

## 2022-08-11 ENCOUNTER — Ambulatory Visit
Admission: EM | Admit: 2022-08-11 | Discharge: 2022-08-11 | Disposition: A | Discharge status: Home / Self Care | Payer: Medicaid Other | Attending: Family Medicine | Admitting: Family Medicine

## 2022-08-11 ENCOUNTER — Other Ambulatory Visit: Payer: Self-pay

## 2022-08-11 DIAGNOSIS — Z20822 Contact with and (suspected) exposure to covid-19: Secondary | ICD-10-CM | POA: Diagnosis present

## 2022-08-11 DIAGNOSIS — R197 Diarrhea, unspecified: Secondary | ICD-10-CM | POA: Insufficient documentation

## 2022-08-11 DIAGNOSIS — J029 Acute pharyngitis, unspecified: Secondary | ICD-10-CM | POA: Insufficient documentation

## 2022-08-11 LAB — POCT RAPID STREP A (OFFICE): Rapid Strep A Screen: NEGATIVE

## 2022-08-11 NOTE — ED Provider Notes (Signed)
[name]    CSN: [id number] Arrival date & time: 08/11/22  1617      History   Chief Complaint Chief Complaint  Patient presents with   Diarrhea    HPI [name] is a 46 y.o. male.   HPI 46 year old male presents with diarrhea for 4 days.  Patient also reports throat discomfort, myalgias, and chills for 4 days as well.  Patient is unvaccinated for COVID-19.  PMH significant for GAD, asthma, gastroenteritis, and glaucoma. Patient requests work note for yesterday and today.  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 No pertinent surgical history.     Home Medications    Prior to Admission medications   Medication Sig Start Date End Date Taking? Authorizing Provider  ALPRAZolam Prudy Feeler) 1 MG tablet Take by mouth. 05/20/21   [provider]  clobetasol (TEMOVATE) 0.05 % external solution Apply topically 2 (two) times daily. 07/07/21   [provider]  diclofenac (VOLTAREN) 75 MG EC tablet Take 1 tablet (75 mg total) by mouth 2 (two) times daily. 04/19/19   Peyton Najjar, MD  hydrOXYzine  (ATARAX/VISTARIL) 25 MG tablet Take 25 mg by mouth 3 (three) times daily. 06/18/21   [provider]  ibuprofen (ADVIL) 800 MG tablet Take 800 mg by mouth 3 (three) times daily. 09/08/20   [provider]  sildenafil (REVATIO) 20 MG tablet SMARTSIG:3-5 Tablet(s) By Mouth 07/02/21   [provider]    Family History Family History  Problem Relation Age of Onset   Healthy Mother    Healthy Father     Social History Social History   Tobacco Use   Smoking status: Every Day    Packs/day: 0.50    Years: 28.00    Total pack years: 14.00    Types: Cigarettes   Smokeless tobacco: Never  Vaping Use   Vaping Use: Former  Substance Use Topics   Alcohol use: No   Drug use: No     Allergies   Other   Review of Systems Review of Systems  Constitutional:  Positive for chills.  HENT:         Throat discomfort for 4 days, denies throat pain  Gastrointestinal:  Positive for diarrhea.  Musculoskeletal:  Positive for myalgias.  All other systems reviewed and are negative.    Physical Exam Triage Vital Signs ED Triage Vitals  Enc Vitals Group     BP 08/11/22 1636 (!) 147/84     Pulse Rate 08/11/22 1636 62     Resp 08/11/22 1636 16     Temp 08/11/22 1636 98.9 F (37.2 C)  Temp Source 08/11/22 1636 Oral     SpO2 08/11/22 1636 99 %     Weight 08/11/22 1637 185 lb (83.9 kg)     Height 08/11/22 1637 6\' 2"  (1.88 m)     Head Circumference --      Peak Flow --      Pain Score 08/11/22 1636 0     Pain Loc --      Pain Edu? --      Excl. in GC? --    No data found.  Updated Vital Signs BP (!) 147/84 (BP Location: Left Arm)   Pulse 62   Temp 98.9 F (37.2 C) (Oral)   Resp 16   Ht 6\' 2"  (1.88 m)   Wt 185 lb (83.9 kg)   SpO2 99%   BMI 23.75 kg/m    Physical Exam Vitals and nursing note reviewed.  Constitutional:      General: He is not in acute distress.    Appearance: Normal appearance. He is normal weight. He is not ill-appearing.  HENT:      Head: Normocephalic and atraumatic.     Right Ear: Tympanic membrane, ear canal and external ear normal.     Left Ear: Tympanic membrane, ear canal and external ear normal.     Mouth/Throat:     Mouth: Mucous membranes are moist.     Pharynx: Oropharynx is clear.  Eyes:     Extraocular Movements: Extraocular movements intact.     Conjunctiva/sclera: Conjunctivae normal.     Pupils: Pupils are equal, round, and reactive to light.  Cardiovascular:     Rate and Rhythm: Normal rate and regular rhythm.     Pulses: Normal pulses.     Heart sounds: Normal heart sounds.  Pulmonary:     Effort: Pulmonary effort is normal.     Breath sounds: Normal breath sounds. No wheezing, rhonchi or rales.  Musculoskeletal:     Cervical back: Normal range of motion and neck supple. No tenderness.  Lymphadenopathy:     Cervical: No cervical adenopathy.  Skin:    General: Skin is warm and dry.  Neurological:     General: No focal deficit present.     Mental Status: He is alert and oriented to person, place, and time.      UC Treatments / Results  Labs (all labs ordered are listed, but only abnormal results are displayed) Labs Reviewed  RESP PANEL BY RT-PCR (FLU A&B, COVID) ARPGX2  CULTURE, GROUP A STREP ([facility])  CBC WITH DIFFERENTIAL/PLATELET  COMPLETE METABOLIC PANEL WITH GFR  POCT RAPID STREP A (OFFICE)    EKG   Radiology No results found.  Procedures Procedures (including critical care time)  Medications Ordered in UC Medications - No data to display  Initial Impression / Assessment and Plan / UC Course  I have reviewed the triage vital signs and the nursing notes.  Pertinent labs & imaging results that were available during my care of the patient were reviewed by me and considered in my medical decision making (see chart for details).     MDM: 1.  Diarrhea in adult patient-CBC with differential, CMP ordered; 2. Sore throat-rapid strep negative, throat culture ordered; 3.   Exposure to COVID-19 virus-respiratory panel ordered. Advised/encouraged patient to increase daily water intake, including Gatorade G2 (lower sugar version of Gatorade) for rehydration.  Advised patient we will follow-up with him once lab results return. Work note provided prior discharged. Discharged home, hemodynamically stable.   Final Clinical  Impressions(s) / UC Diagnoses   Final diagnoses:  Diarrhea in adult patient  Sore throat  Exposure to COVID-19 virus     Discharge Instructions      Advised/encouraged patient to increase daily water intake, including Gatorade G2 (lower sugar version of Gatorade) for rehydration.  Advised patient we will follow-up with him once lab results return.      ED Prescriptions   None    PDMP not reviewed this encounter.   Trevor Iha, FNP 08/11/22 1719

## 2022-08-11 NOTE — Discharge Instructions (Addendum)
Advised/encouraged patient to increase daily water intake, including Gatorade G2 (lower sugar version of Gatorade) for rehydration.  Advised patient we will follow-up with him once lab results return.

## 2022-08-11 NOTE — ED Triage Notes (Addendum)
Diarrhea x 4 days, hot and cold Throat pain Thigh pain Unvaccinated

## 2022-08-12 ENCOUNTER — Telehealth: Payer: Self-pay

## 2022-08-12 LAB — CBC WITH DIFFERENTIAL/PLATELET
Absolute Monocytes: 855 cells/uL (ref 200–950)
Basophils Absolute: 9 cells/uL (ref 0–200)
Basophils Relative: 0.1 %
Eosinophils Absolute: 38 cells/uL (ref 15–500)
Eosinophils Relative: 0.4 %
HCT: 40.6 % (ref 38.5–50.0)
Hemoglobin: 14 g/dL (ref 13.2–17.1)
Lymphs Abs: 2378 cells/uL (ref 850–3900)
MCH: 33.7 pg — ABNORMAL HIGH (ref 27.0–33.0)
MCHC: 34.5 g/dL (ref 32.0–36.0)
MCV: 97.6 fL (ref 80.0–100.0)
MPV: 10.4 fL (ref 7.5–12.5)
Monocytes Relative: 9.1 %
Neutro Abs: 6119 cells/uL (ref 1500–7800)
Neutrophils Relative %: 65.1 %
Platelets: 266 10*3/uL (ref 140–400)
RBC: 4.16 10*6/uL — ABNORMAL LOW (ref 4.20–5.80)
RDW: 13 % (ref 11.0–15.0)
Total Lymphocyte: 25.3 %
WBC: 9.4 10*3/uL (ref 3.8–10.8)

## 2022-08-12 LAB — COMPLETE METABOLIC PANEL WITH GFR
AG Ratio: 2.4 (calc) (ref 1.0–2.5)
ALT: 23 U/L (ref 9–46)
AST: 18 U/L (ref 10–40)
Albumin: 4.5 g/dL (ref 3.6–5.1)
Alkaline phosphatase (APISO): 38 U/L (ref 36–130)
BUN: 13 mg/dL (ref 7–25)
CO2: 22 mmol/L (ref 20–32)
Calcium: 9.5 mg/dL (ref 8.6–10.3)
Chloride: 108 mmol/L (ref 98–110)
Creat: 0.79 mg/dL (ref 0.60–1.29)
Globulin: 1.9 g/dL (calc) (ref 1.9–3.7)
Glucose, Bld: 89 mg/dL (ref 65–99)
Potassium: 3.6 mmol/L (ref 3.5–5.3)
Sodium: 140 mmol/L (ref 135–146)
Total Bilirubin: 0.4 mg/dL (ref 0.2–1.2)
Total Protein: 6.4 g/dL (ref 6.1–8.1)
eGFR: 111 mL/min/{1.73_m2} (ref >=60)

## 2022-08-12 LAB — RESP PANEL BY RT-PCR (FLU A&B, COVID) ARPGX2
Influenza A by PCR: NEGATIVE
Influenza B by PCR: NEGATIVE
SARS Coronavirus 2 by RT PCR: NEGATIVE

## 2022-08-12 NOTE — Telephone Encounter (Signed)
TC to f/u with pt after yesterday's visit to [facility]. Pt reports he is feeling about the same, still awaiting test results from yesterday. No questions or concerns at this time.

## 2022-08-14 LAB — CULTURE, GROUP A STREP (THRC)

## 2022-10-12 ENCOUNTER — Ambulatory Visit
Admission: EM | Admit: 2022-10-12 | Discharge: 2022-10-12 | Disposition: A | Discharge status: Home / Self Care | Payer: Medicaid Other | Attending: Family Medicine | Admitting: Family Medicine

## 2022-10-12 DIAGNOSIS — L237 Allergic contact dermatitis due to plants, except food: Secondary | ICD-10-CM | POA: Diagnosis not present

## 2022-10-12 MED ORDER — METHYLPREDNISOLONE SODIUM SUCC 125 MG IJ SOLR
125.0000 mg | Freq: Once | INTRAMUSCULAR | Status: AC
  Administered 2022-10-12: 125 mg via INTRAMUSCULAR

## 2022-10-12 MED ORDER — PREDNISONE 20 MG PO TABS: ORAL_TABLET | ORAL | 0 refills | Status: DC

## 2022-10-12 NOTE — ED Triage Notes (Signed)
Pt presents with c/o poison ivy x 2 days.

## 2022-10-12 NOTE — Discharge Instructions (Addendum)
Advised patient to take medication as directed with food to completion.  Encouraged patient to crease daily water intake while taking this medication.  Advised patient to change bed linens for the next 3 evenings to avoid recontamination.  Advised if symptoms worsen and/or unresolved please follow-up with PCP or here for further evaluation.

## 2022-10-12 NOTE — ED Provider Notes (Signed)
[name]    CSN: [id number] Arrival date & time: 10/12/22  1902      History   Chief Complaint Chief Complaint  Patient presents with   Rash    HPI [name] is a 46 y.o. male.   HPI 46 year old male presents with poison ivy x 2 days.  PMH significant for asthma and depression.  Past Medical History:  Diagnosis Date   Anxiety    Asthma    COVID-19 04/2021   Depression    Glaucoma     Patient Active Problem List   Diagnosis Date Noted   ADHD (attention deficit hyperactivity disorder) 09/27/2020   Encounter for long-term (current) use of other medications 09/27/2020   Mild intermittent asthma 04/02/2020   Angular cheilitis with candidiasis 02/05/2020   Neck pain 12/04/2019   Opioid dependence on agonist therapy ([place]) 12/04/2019   Opioid use disorder 09/27/2019   Syncope 03/27/2019   Smoker 03/03/2018   Depression with anxiety 02/03/2018   Pulmonary nodules 02/03/2018   Chest wall pain 01/20/2018   Anxiety 08/27/2011   Decreased appetite 08/27/2011   Insomnia 08/27/2011   Rash 08/27/2011   Restless leg 08/27/2011   Gastroenteritis, non-infectious 06/08/2011   ASTHMA 05/20/2011   Unspecified asthma, uncomplicated 60/63/0160    History reviewed. No pertinent surgical history.     Home Medications    Prior to Admission medications   Medication Sig Start Date End Date Taking? Authorizing Provider  predniSONE (DELTASONE) 20 MG tablet Take 3 tabs PO daily x 5 days. 10/12/22  Yes Eliezer Lofts, FNP  ALPRAZolam Duanne Moron) 1 MG tablet Take by mouth. 05/20/21   [provider]  clobetasol (TEMOVATE) 0.05 % external solution Apply topically 2 (two) times daily. 07/07/21   [provider]  diclofenac (VOLTAREN) 75 MG EC tablet Take 1 tablet (75 mg total) by mouth 2 (two) times daily. 04/19/19   Posey Boyer, MD  hydrOXYzine (ATARAX/VISTARIL) 25 MG tablet Take 25 mg by mouth 3 (three) times daily. Patient not taking: Reported on  10/12/2022 06/18/21   [provider]  ibuprofen (ADVIL) 800 MG tablet Take 800 mg by mouth 3 (three) times daily. 09/08/20   [provider]  sildenafil (REVATIO) 20 MG tablet SMARTSIG:3-5 Tablet(s) By Mouth 07/02/21   [provider]    Family History Family History  Problem Relation Age of Onset   Healthy Mother    Healthy Father     Social History Social History   Tobacco Use   Smoking status: Every Day    Packs/day: 0.50    Years: 28.00    Total pack years: 14.00    Types: Cigarettes   Smokeless tobacco: Never  Vaping Use   Vaping Use: Former  Substance Use Topics   Alcohol use: No   Drug use: No     Allergies   Other   Review of Systems Review of Systems  Skin:  Positive for rash.     Physical Exam Triage Vital Signs ED Triage Vitals [10/12/22 1924]  Enc Vitals Group     BP 129/84     Pulse Rate 64     Resp 14     Temp 97.9 F (36.6 C)     Temp Source Oral     SpO2 98 %     Weight      Height      Head Circumference      Peak Flow      Pain Score 0  Pain Loc      Pain Edu?      Excl. in GC?    No data found.  Updated Vital Signs BP 129/84 (BP Location: Left Arm)   Pulse 64   Temp 97.9 F (36.6 C) (Oral)   Resp 14   SpO2 98%    Physical Exam Vitals and nursing note reviewed.  Constitutional:      General: He is not in acute distress.    Appearance: Normal appearance. He is normal weight. He is not ill-appearing.  HENT:     Head: Normocephalic and atraumatic.     Right Ear: Tympanic membrane, ear canal and external ear normal.     Left Ear: Tympanic membrane, ear canal and external ear normal.     Mouth/Throat:     Mouth: Mucous membranes are moist.     Pharynx: Oropharynx is clear.  Eyes:     Extraocular Movements: Extraocular movements intact.     Conjunctiva/sclera: Conjunctivae normal.     Pupils: Pupils are equal, round, and reactive to light.  Cardiovascular:     Rate and Rhythm: Normal rate  and regular rhythm.     Pulses: Normal pulses.     Heart sounds: Normal heart sounds.  Pulmonary:     Effort: Pulmonary effort is normal.     Breath sounds: Normal breath sounds. No wheezing, rhonchi or rales.  Musculoskeletal:     Cervical back: Normal range of motion and neck supple.  Skin:    General: Skin is warm and dry.     Comments: Face/bilateral lower arms (volar aspect)/upper legs bilaterally (medial aspects): Pruritic erythematous maculopapular eruption with linear vesicular lesions noted  Neurological:     General: No focal deficit present.     Mental Status: He is alert and oriented to person, place, and time.      UC Treatments / Results  Labs (all labs ordered are listed, but only abnormal results are displayed) Labs Reviewed - No data to display  EKG   Radiology No results found.  Procedures Procedures (including critical care time)  Medications Ordered in UC Medications  methylPREDNISolone sodium succinate (SOLU-MEDROL) 125 mg/2 mL injection 125 mg (125 mg Intramuscular Given 10/12/22 1952)    Initial Impression / Assessment and Plan / UC Course  I have reviewed the triage vital signs and the nursing notes.  Pertinent labs & imaging results that were available during my care of the patient were reviewed by me and considered in my medical decision making (see chart for details).     MDM: 1.  Poison ivy dermatitis-IM Solu-Medrol 125 mg given once in clinic, Rx'd prednisone. Advised patient to take medication as directed with food to completion.  Encouraged patient to crease daily water intake while taking this medication.  Advised patient to change bed linens for the next 3 evenings to avoid recontamination.  Advised if symptoms worsen and/or unresolved please follow-up with PCP or here for further evaluation.  Patient discharged home, hemodynamically stable. Final Clinical Impressions(s) / UC Diagnoses   Final diagnoses:  Poison ivy dermatitis      Discharge Instructions      Advised patient to take medication as directed with food to completion.  Encouraged patient to crease daily water intake while taking this medication.  Advised patient to change bed linens for the next 3 evenings to avoid recontamination.  Advised if symptoms worsen and/or unresolved please follow-up with PCP or here for further evaluation.     ED Prescriptions  Medication Sig Dispense Auth. Provider   predniSONE (DELTASONE) 20 MG tablet Take 3 tabs PO daily x 5 days. 15 tablet Trevor Iha, FNP      PDMP not reviewed this encounter.   Trevor Iha, FNP 10/12/22 2000

## 2022-10-13 ENCOUNTER — Telehealth: Payer: Self-pay

## 2022-10-13 NOTE — Telephone Encounter (Signed)
Tried to complete callback. No asnwer and no VM to leave msg.

## 2022-10-19 ENCOUNTER — Ambulatory Visit
Admission: EM | Admit: 2022-10-19 | Discharge: 2022-10-19 | Disposition: A | Discharge status: Home / Self Care | Payer: Medicaid Other | Attending: Family Medicine | Admitting: Family Medicine

## 2022-10-19 DIAGNOSIS — L237 Allergic contact dermatitis due to plants, except food: Secondary | ICD-10-CM

## 2022-10-19 MED ORDER — METHYLPREDNISOLONE ACETATE 80 MG/ML IJ SUSP
80.0000 mg | Freq: Once | INTRAMUSCULAR | Status: AC
  Administered 2022-10-19: 80 mg via INTRAMUSCULAR

## 2022-10-19 NOTE — Discharge Instructions (Signed)
I have given you a steroid shot that should last for 7 to 10 days May take Benadryl for itching at night, may take Claritin or Zyrtec during the day Return as needed

## 2022-10-19 NOTE — ED Provider Notes (Signed)
[name]    CSN: [id number] Arrival date & time: 10/19/22  1922      History   Chief Complaint Chief Complaint  Patient presents with   Poison Ivy    Follow up on poison ivy. X1 week    HPI [name] is a 46 y.o. male.   HPI  Patient is very allergic to poison ivy.  He states he always has difficulty getting rid of it.  He was seen for poison ivy over a week ago.  He was given 5 days of steroids.  Since he completed the steroids the poison ivy started to spread.  Its terribly itchy.  He is here requesting an injection  Past Medical History:  Diagnosis Date   Anxiety    Asthma    COVID-19 04/2021   Depression    Glaucoma     Patient Active Problem List   Diagnosis Date Noted   ADHD (attention deficit hyperactivity disorder) 09/27/2020   Encounter for long-term (current) use of other medications 09/27/2020   Mild intermittent asthma 04/02/2020   Angular cheilitis with candidiasis 02/05/2020   Neck pain 12/04/2019   Opioid dependence on agonist therapy ([facility]) 12/04/2019   Opioid use disorder 09/27/2019   Syncope 03/27/2019   Smoker 03/03/2018   Depression with anxiety 02/03/2018   Pulmonary nodules 02/03/2018   Chest wall pain 01/20/2018   Anxiety 08/27/2011   Decreased appetite 08/27/2011   Insomnia 08/27/2011   Rash 08/27/2011   Restless leg 08/27/2011   Gastroenteritis, non-infectious 06/08/2011   ASTHMA 05/20/2011   Unspecified asthma, uncomplicated 05/20/2011    History reviewed. No pertinent surgical history.     Home Medications    Prior to Admission medications   Medication Sig Start Date End Date Taking? Authorizing Provider  ALPRAZolam Prudy Feeler) 1 MG tablet Take by mouth. 05/20/21  Yes [provider]  clobetasol (TEMOVATE) 0.05 % external solution Apply topically 2 (two) times daily. 07/07/21  Yes [provider]  diclofenac (VOLTAREN) 75 MG EC tablet Take 1 tablet (75 mg total) by mouth 2 (two) times daily.  04/19/19  Yes Peyton Najjar, MD  hydrOXYzine (ATARAX/VISTARIL) 25 MG tablet Take 25 mg by mouth 3 (three) times daily. 06/18/21  Yes [provider]  ibuprofen (ADVIL) 800 MG tablet Take 800 mg by mouth 3 (three) times daily. 09/08/20  Yes [provider]  sildenafil (REVATIO) 20 MG tablet SMARTSIG:3-5 Tablet(s) By Mouth 07/02/21  Yes [provider]    Family History Family History  Problem Relation Age of Onset   Healthy Mother    Healthy Father     Social History Social History   Tobacco Use   Smoking status: Every Day    Packs/day: 0.50    Years: 28.00    Total pack years: 14.00    Types: Cigarettes   Smokeless tobacco: Never  Vaping Use   Vaping Use: Former  Substance Use Topics   Alcohol use: No   Drug use: No     Allergies   Other   Review of Systems Review of Systems  See HPI Physical Exam Triage Vital Signs ED Triage Vitals  Enc Vitals Group     BP 10/19/22 1930 119/73     Pulse Rate 10/19/22 1930 72     Resp 10/19/22 1930 18     Temp 10/19/22 1930 97.9 F (36.6 C)     Temp Source 10/19/22 1930 Oral     SpO2 10/19/22 1930 97 %  Weight 10/19/22 1929 195 lb (88.5 kg)     Height 10/19/22 1929 6\' 2"  (1.88 m)     Head Circumference --      Peak Flow --      Pain Score 10/19/22 1928 0     Pain Loc --      Pain Edu? --      Excl. in [place]? --    No data found.  Updated Vital Signs BP 119/73 (BP Location: Left Arm)   Pulse 72   Temp 97.9 F (36.6 C) (Oral)   Resp 18   Ht 6\' 2"  (1.88 m)   Wt 88.5 kg   SpO2 97%   BMI 25.04 kg/m      Physical Exam Constitutional:      General: He is not in acute distress.    Appearance: He is well-developed and normal weight.  HENT:     Head: Normocephalic and atraumatic.  Eyes:     Conjunctiva/sclera: Conjunctivae normal.     Pupils: Pupils are equal, round, and reactive to light.  Cardiovascular:     Rate and Rhythm: Normal rate.  Pulmonary:     Effort: Pulmonary effort is  normal. No respiratory distress.  Abdominal:     General: There is no distension.     Palpations: Abdomen is soft.  Musculoskeletal:        General: Normal range of motion.     Cervical back: Normal range of motion.  Skin:    General: Skin is warm and dry.     Findings: Rash present.     Comments: Patches of vesicles on erythematous base on both forearms noted.  Mild soft tissue swelling in right forearm.  Neurological:     Mental Status: He is alert.  Psychiatric:        Mood and Affect: Mood normal.        Behavior: Behavior normal.      UC Treatments / Results  Labs (all labs ordered are listed, but only abnormal results are displayed) Labs Reviewed - No data to display  EKG   Radiology No results found.  Procedures Procedures (including critical care time)  Medications Ordered in UC Medications  methylPREDNISolone acetate (DEPO-MEDROL) injection 80 mg (has no administration in time range)    Initial Impression / Assessment and Plan / UC Course  I have reviewed the triage vital signs and the nursing notes.  Pertinent labs & imaging results that were available during my care of the patient were reviewed by me and considered in my medical decision making (see chart for details).     Final Clinical Impressions(s) / UC Diagnoses   Final diagnoses:  Allergic contact dermatitis due to plants, except food     Discharge Instructions      I have given you a steroid shot that should last for 7 to 10 days May take Benadryl for itching at night, may take Claritin or Zyrtec during the day Return as needed   ED Prescriptions   None    PDMP not reviewed this encounter.   Raylene Everts, MD 10/19/22 [name]

## 2022-10-19 NOTE — ED Triage Notes (Signed)
Pt states that he was seen for poison ivy x1 week ago. Pt states that he finished medication today and rash came back.

## 2023-04-10 ENCOUNTER — Ambulatory Visit (INDEPENDENT_AMBULATORY_CARE_PROVIDER_SITE_OTHER): Payer: Medicaid Other

## 2023-04-10 ENCOUNTER — Encounter: Payer: Self-pay | Admitting: Emergency Medicine

## 2023-04-10 ENCOUNTER — Ambulatory Visit
Admission: EM | Admit: 2023-04-10 | Discharge: 2023-04-10 | Disposition: A | Discharge status: Home / Self Care | Payer: Medicaid Other | Attending: Family Medicine | Admitting: Family Medicine

## 2023-04-10 DIAGNOSIS — R140 Abdominal distension (gaseous): Secondary | ICD-10-CM

## 2023-04-10 DIAGNOSIS — R109 Unspecified abdominal pain: Secondary | ICD-10-CM

## 2023-04-10 DIAGNOSIS — R112 Nausea with vomiting, unspecified: Secondary | ICD-10-CM | POA: Diagnosis not present

## 2023-04-10 MED ORDER — ONDANSETRON HCL 8 MG PO TABS: 8.0000 mg | ORAL_TABLET | Freq: Three times a day (TID) | ORAL | 0 refills | Status: DC | PRN

## 2023-04-10 NOTE — Discharge Instructions (Addendum)
Take sure you are drinking lots of water Take the Zofran to prevent nausea and vomiting Take MiraLAX or Metamucil daily until bowels are regular Might needs strong laxative if this does not work Make certain that you eat a high-fiber diet Call your doctor if not improving by next week

## 2023-04-10 NOTE — ED Triage Notes (Signed)
Patient c/o vomiting at night and diarrhea x 1 week.  Patient is having stomach pain, feels hard, after using the bathroom stomach feels soft.  Unsure if it's a virus.  Denies any fever.

## 2023-04-10 NOTE — ED Provider Notes (Signed)
[name]    CSN: [id number] Arrival date & time: 04/10/23  1508      History   Chief Complaint Chief Complaint  Patient presents with   Emesis    HPI [name] is a 47 y.o. male.   HPI  Acel is having some abdominal pain, distention.  States his abdomen feels "hard".  He states that every night when he tries to lie down he has vomiting.  He states he has had some loose bowels and diarrhea.  Does not know when his last normal bowel movement was.  No fever or chills.  Did not eat anything that did not agree with him.  No illness going through the household.  Wonders whether he has a virus.  No history of stomach problems or IBS.  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 No pertinent surgical history.     Home Medications    Prior to Admission medications   Medication Sig Start Date End Date Taking? Authorizing Provider  ALPRAZolam Prudy Feeler) 1 MG tablet Take by mouth. 05/20/21  Yes [provider]  clobetasol (TEMOVATE) 0.05 % external solution Apply topically 2 (two) times daily. 07/07/21  Yes [provider]  ondansetron (ZOFRAN) 8 MG tablet Take 1 tablet (8 mg total) by mouth every 8 (eight) hours as needed for nausea or vomiting. 04/10/23  Yes Eustace Moore, MD  QUEtiapine (SEROQUEL) 100 MG tablet Take 100 mg by mouth daily. 03/08/23  Yes [provider]  sildenafil (REVATIO) 20 MG tablet SMARTSIG:3-5 Tablet(s) By Mouth 07/02/21  Yes [provider]    Family History Family History  Problem Relation Age of Onset   Healthy Mother    Healthy Father     Social History Social History   Tobacco Use   Smoking status: Every Day    Packs/day: 0.50    Years: 28.00    Additional pack years: 0.00    Total pack years: 14.00    Types: Cigarettes   Smokeless tobacco: Never  Vaping Use   Vaping Use: Former  Substance Use Topics   Alcohol use: No   Drug use: No     Allergies   Other   Review of Systems Review of Systems See HPI  Physical Exam Triage Vital Signs ED Triage Vitals  Enc Vitals Group     BP 04/10/23 1517 130/83     Pulse Rate 04/10/23 1517 69     Resp 04/10/23 1517 16     Temp 04/10/23 1517 98.1 F (36.7 C)     Temp Source 04/10/23 1517 Oral     SpO2  04/10/23 1517 98 %     Weight 04/10/23 1518 185 lb (83.9 kg)     Height 04/10/23 1518 6\' 2"  (1.88 m)     Head Circumference --      Peak Flow --      Pain Score 04/10/23 1518 0     Pain Loc --      Pain Edu? --      Excl. in [place]? --    No data found.  Updated Vital Signs BP 130/83 (BP Location: Right Arm)   Pulse 69   Temp 98.1 F (36.7 C) (Oral)   Resp 16   Ht 6\' 2"  (1.88 m)   Wt 83.9 kg   SpO2 98%   BMI 23.75 kg/m      Physical Exam Constitutional:      General: He is not in acute distress.    Appearance: Normal appearance. He is well-developed.     Comments: Anxious about health  HENT:     Head: Normocephalic and atraumatic.     Right Ear: Tympanic membrane and ear canal normal.     Left Ear: Tympanic membrane and ear canal normal.     Nose: Nose normal. No  rhinorrhea.     Mouth/Throat:     Pharynx: No posterior oropharyngeal erythema.  Eyes:     Conjunctiva/sclera: Conjunctivae normal.     Pupils: Pupils are equal, round, and reactive to light.  Cardiovascular:     Rate and Rhythm: Normal rate and regular rhythm.     Heart sounds: Normal heart sounds.  Pulmonary:     Effort: Pulmonary effort is normal. No respiratory distress.     Breath sounds: Normal breath sounds.  Abdominal:     General: Abdomen is flat. Bowel sounds are normal. There is no distension.     Palpations: Abdomen is soft.     Comments: Abdomen is diffusely tender.  Firm to touch.  Tense abdominal muscles.  No guarding or rebound, no mass or organomegaly  Musculoskeletal:        General: Normal range of motion.     Cervical back: Normal range of motion. No tenderness.  Lymphadenopathy:     Cervical: No cervical adenopathy.  Skin:    General: Skin is warm and dry.  Neurological:     Mental Status: He is alert.      UC Treatments / Results  Labs (all labs ordered are listed, but only abnormal results are displayed) Labs Reviewed - No data to display  EKG   Radiology DG Abdomen 1 View  Result Date: 04/10/2023 CLINICAL DATA:  Abdominal pain and distension. EXAM: ABDOMEN - 1 VIEW COMPARISON:  None Available. FINDINGS: No bowel dilatation to suggest obstruction. Small volume of colonic stool. Calcifications in the pelvis are typical of phleboliths. No definite abdominal calculi. No concerning intraabdominal mass effect. No acute osseous abnormalities are seen. IMPRESSION: Normal bowel gas pattern.  No radiographic explanation for symptoms. Electronically Signed   By: Narda Rutherford [name]   On: 04/10/2023 16:04    Procedures Procedures (including critical care time)  Medications Ordered in UC Medications - No data to display  Initial Impression / Assessment and Plan / UC Course  I have reviewed the triage vital signs and the nursing notes.  Pertinent labs &  imaging results that were available during my care of the patient were reviewed by me and considered in my medical decision making (see chart for details).     Patient was recently started  on Seroquel.  Has changed bowel habits.  Has a stomach that feels "hard" both bouts of diarrhea, with some nausea and vomiting.  I would have him increase his fiber and give him Zofran for the nausea.  Drink more water.  See if this helps get him regulated.  Follow-up with PCP. Final Clinical Impressions(s) / UC Diagnoses   Final diagnoses:  Abdominal distension  Nausea and vomiting, unspecified vomiting type     Discharge Instructions      Take sure you are drinking lots of water Take the Zofran to prevent nausea and vomiting Take MiraLAX or Metamucil daily until bowels are regular Might needs strong laxative if this does not work Make certain that you eat a high-fiber diet Call your doctor if not improving by next week     ED Prescriptions     Medication Sig Dispense Auth. Provider   ondansetron (ZOFRAN) 8 MG tablet Take 1 tablet (8 mg total) by mouth every 8 (eight) hours as needed for nausea or vomiting. 20 tablet Eustace Moore, MD      PDMP not reviewed this encounter.   Eustace Moore, MD 04/10/23 [phone]

## 2023-04-26 ENCOUNTER — Ambulatory Visit
Admission: EM | Admit: 2023-04-26 | Discharge: 2023-04-26 | Disposition: A | Discharge status: Home / Self Care | Payer: Medicaid Other | Attending: Family Medicine | Admitting: Family Medicine

## 2023-04-26 DIAGNOSIS — L237 Allergic contact dermatitis due to plants, except food: Secondary | ICD-10-CM

## 2023-04-26 MED ORDER — PREDNISONE 10 MG (21) PO TBPK: ORAL_TABLET | Freq: Every day | ORAL | 0 refills | Status: DC

## 2023-04-26 MED ORDER — METHYLPREDNISOLONE ACETATE 80 MG/ML IJ SUSP
80.0000 mg | Freq: Once | INTRAMUSCULAR | Status: AC
  Administered 2023-04-26: 80 mg via INTRAMUSCULAR

## 2023-04-26 NOTE — ED Triage Notes (Signed)
Pt c/o rash d/t poison ivy x 2 days.

## 2023-04-26 NOTE — ED Provider Notes (Signed)
[name]    CSN: [id number] Arrival date & time: 04/26/23  1459      History   Chief Complaint Chief Complaint  Patient presents with   Rash    D/t poison ivy    HPI RAHEEN CAPILI is a 47 y.o. male.   HPI Pleasant 47 year old male presents with poison ivy rash of the ears, arm, and legs for 4 days.  PMH significant for current everyday cigarette smoker, pulmonary nodules, and opioid use disorder.  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 No pertinent surgical history.     Home Medications    Prior to Admission medications   Medication Sig Start Date End Date Taking? Authorizing Provider  predniSONE (STERAPRED UNI-PAK 21 TAB) 10 MG (21) TBPK tablet Take by mouth daily. Take 6 tabs by mouth daily  for 2 days, then 5 tabs for 2 days, then 4 tabs for 2 days, then 3 tabs for 2 days, 2 tabs for 2 days, then 1 tab by mouth daily for 2 days 04/26/23  Yes Trevor Iha, FNP  ALPRAZolam Prudy Feeler) 1 MG tablet Take by mouth. 05/20/21   [provider]  clobetasol (TEMOVATE) 0.05 % external solution Apply topically 2  (two) times daily. 07/07/21   [provider]  ondansetron (ZOFRAN) 8 MG tablet Take 1 tablet (8 mg total) by mouth every 8 (eight) hours as needed for nausea or vomiting. 04/10/23   Eustace Moore, MD  QUEtiapine (SEROQUEL) 100 MG tablet Take 100 mg by mouth daily. 03/08/23   [provider]  sildenafil (REVATIO) 20 MG tablet SMARTSIG:3-5 Tablet(s) By Mouth 07/02/21   [provider]    Family History Family History  Problem Relation Age of Onset   Healthy Mother    Healthy Father     Social History Social History   Tobacco Use   Smoking status: Every Day    Packs/day: 0.50    Years: 28.00    Additional pack years: 0.00    Total pack years: 14.00    Types: Cigarettes   Smokeless tobacco: Never  Vaping Use   Vaping Use: Former  Substance Use Topics   Alcohol use: No   Drug use: No     Allergies   Other and Poison ivy extract   Review of Systems Review of Systems  Skin:  Positive for rash.  All other systems reviewed and are negative.    Physical Exam Triage Vital Signs ED Triage Vitals  Enc Vitals Group     BP 04/26/23 1505 121/78     Pulse Rate 04/26/23 1505 61  Resp 04/26/23 1505 16     Temp 04/26/23 1505 97.6 F (36.4 C)     Temp Source 04/26/23 1505 Oral     SpO2 04/26/23 1505 98 %     Weight --      Height --      Head Circumference --      Peak Flow --      Pain Score 04/26/23 1506 0     Pain Loc --      Pain Edu? --      Excl. in GC? --    No data found.  Updated Vital Signs BP 121/78 (BP Location: Right Arm)   Pulse 61   Temp 97.6 F (36.4 C) (Oral)   Resp 16   SpO2 98%   Physical Exam Vitals and nursing note reviewed.  Constitutional:      General: He is not in acute distress.    Appearance: Normal appearance. He is normal weight. He is not ill-appearing.  HENT:     Head: Normocephalic and atraumatic.     Mouth/Throat:     Mouth: Mucous membranes are moist.     Pharynx: Oropharynx is clear.  Eyes:      Extraocular Movements: Extraocular movements intact.     Conjunctiva/sclera: Conjunctivae normal.     Pupils: Pupils are equal, round, and reactive to light.  Cardiovascular:     Rate and Rhythm: Normal rate and regular rhythm.     Pulses: Normal pulses.     Heart sounds: Normal heart sounds. No murmur heard. Pulmonary:     Effort: Pulmonary effort is normal.     Breath sounds: Normal breath sounds. No wheezing, rhonchi or rales.  Musculoskeletal:        General: Normal range of motion.     Cervical back: Normal range of motion.  Skin:    General: Skin is warm and dry.     Comments: Bilateral lower legs (anterior aspects)/bilateral lower arms (volar aspects)/posterior outer ear-bilaterally: Pruritic maculopapular eruption with grouped linear vesicular lesions noted  Neurological:     General: No focal deficit present.     Mental Status: He is alert and oriented to person, place, and time. Mental status is at baseline.  Psychiatric:        Mood and Affect: Mood normal.        Behavior: Behavior normal.        Thought Content: Thought content normal.        Judgment: Judgment normal.      UC Treatments / Results  Labs (all labs ordered are listed, but only abnormal results are displayed) Labs Reviewed - No data to display  EKG   Radiology No results found.  Procedures Procedures (including critical care time)  Medications Ordered in UC Medications  methylPREDNISolone acetate (DEPO-MEDROL) injection 80 mg (80 mg Intramuscular Given 04/26/23 1540)    Initial Impression / Assessment and Plan / UC Course  I have reviewed the triage vital signs and the nursing notes.  Pertinent labs & imaging results that were available during my care of the patient were reviewed by me and considered in my medical decision making (see chart for details).     MDM: 1.  Poison ivy dermatitis-IM Depo-Medrol 80 mg given once in clinic and prior to discharge, Rx'd Sterapred Unipak  (tapering from 60 mg to 10 mg over 10 days). Instructed patient to take medication as directed with food to completion.  Encouraged increase daily water intake to 64 ounces  per day while taking this medication.  Advised if symptoms worsen and/or unresolved please follow-up with PCP or here for further evaluation.  Patient discharged home, hemodynamically stable. Final Clinical Impressions(s) / UC Diagnoses   Final diagnoses:  Poison ivy dermatitis     Discharge Instructions      Instructed patient to take medication as directed with food to completion.  Encouraged increase daily water intake to 64 ounces per day while taking this medication.  Advised if symptoms worsen and/or unresolved please follow-up with PCP or here for further evaluation.     ED Prescriptions     Medication Sig Dispense Auth. Provider   predniSONE (STERAPRED UNI-PAK 21 TAB) 10 MG (21) TBPK tablet Take by mouth daily. Take 6 tabs by mouth daily  for 2 days, then 5 tabs for 2 days, then 4 tabs for 2 days, then 3 tabs for 2 days, 2 tabs for 2 days, then 1 tab by mouth daily for 2 days 42 tablet Trevor Iha, FNP      PDMP not reviewed this encounter.   Trevor Iha, FNP 04/26/23 1610

## 2023-04-26 NOTE — Discharge Instructions (Addendum)
Instructed patient to take medication as directed with food to completion.  Encouraged increase daily water intake to 64 ounces per day while taking this medication.  Advised if symptoms worsen and/or unresolved please follow-up with PCP or here for further evaluation. 

## 2023-06-22 ENCOUNTER — Ambulatory Visit
Admission: EM | Admit: 2023-06-22 | Discharge: 2023-06-22 | Disposition: A | Discharge status: Home / Self Care | Payer: Medicaid Other | Attending: Family Medicine | Admitting: Family Medicine

## 2023-06-22 ENCOUNTER — Encounter: Payer: Self-pay | Admitting: Emergency Medicine

## 2023-06-22 DIAGNOSIS — U071 COVID-19: Secondary | ICD-10-CM

## 2023-06-22 LAB — POC SARS CORONAVIRUS 2 AG -  ED: SARS Coronavirus 2 Ag: POSITIVE — AB

## 2023-06-22 LAB — POCT INFLUENZA A/B
Influenza A, POC: NEGATIVE
Influenza B, POC: NEGATIVE

## 2023-06-22 MED ORDER — PAXLOVID (300/100) 20 X 150 MG & 10 X 100MG PO TBPK: 3.0000 | ORAL_TABLET | Freq: Two times a day (BID) | ORAL | 0 refills | Status: AC

## 2023-06-22 NOTE — Discharge Instructions (Signed)
Do not take sildenafil (Revatio) while taking Paxlovid. Take plain guaifenesin (1200mg  extended release tabs such as Mucinex) twice daily, with plenty of water, for cough and congestion.  Get adequate rest.   Try warm salt water gargles for sore throat.  Stop all antihistamines for now, and other non-prescription cough/cold preparations. May take Ibuprofen as needed.  Your COVID-19 test is positive.  Isolate yourself for five days from today.  At the end of five days you may end isolation if your symptoms have cleared or improved, and you have not had a fever for 24 hours. At this time you should wear a mask for five more days when you are around others.  If symptoms become significantly worse during the night or over the weekend, proceed to the local emergency room.

## 2023-06-22 NOTE — ED Triage Notes (Signed)
Pt started w/ a runny nose, H/A, body aches & chills since yesterday Ibuprofen 1600mg  at 1830 Cold meds this am OT C No relief per pt  Concerns for flu

## 2023-06-22 NOTE — ED Provider Notes (Signed)
[name]    CSN: [id number] Arrival date & time: 06/22/23  1440      History   Chief Complaint Chief Complaint  Patient presents with   Generalized Body Aches    HPI [name] is a 47 y.o. male.   Yesterday patient developed a migraine headache, somewhat worse than usual, followed by sweats.  Today he had generalized myalgias, fatigue, nasal congestion, and cough.  He also has had nausea (without vomiting) and diarrhea.  He has a history of asthma but denies wheezing, shortness of breath and pleuritic pain.  The history is provided by the patient.    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 No pertinent surgical history.     Home Medications    Prior to Admission medications   Medication Sig Start Date End Date Taking? Authorizing Provider  ibuprofen (ADVIL) 800 MG tablet Take 1 tablet by mouth 3 (three) times daily. 10/11/22  Yes [provider]  nirmatrelvir & ritonavir (PAXLOVID, 300/100,) 20 x 150 MG & 10 x 100MG  TBPK Take 3 tablets by mouth 2 (two) times daily for 5 days. 06/22/23 06/27/23 Yes  Lattie Haw, MD  timolol (TIMOPTIC) 0.5 % ophthalmic solution PLACE ONE DROP INTO BOTH EYES DAILY. 04/04/23  Yes [provider]  ALPRAZolam Prudy Feeler) 1 MG tablet Take by mouth. 05/20/21   [provider]  clobetasol (TEMOVATE) 0.05 % external solution Apply topically 2 (two) times daily. Patient not taking: Reported on 06/22/2023 07/07/21   [provider]  diclofenac (VOLTAREN) 75 MG EC tablet Take 75 mg by mouth 2 (two) times daily.    [provider]  ondansetron (ZOFRAN) 8 MG tablet Take 1 tablet (8 mg total) by mouth every 8 (eight) hours as needed for nausea or vomiting. Patient not taking: Reported on 06/22/2023 04/10/23   Eustace Moore, MD  predniSONE (STERAPRED UNI-PAK 21 TAB) 10 MG (21) TBPK tablet Take by mouth daily. Take 6 tabs by mouth daily  for 2 days, then 5 tabs for 2 days, then 4 tabs for 2 days, then 3 tabs for 2 days, 2 tabs for 2 days, then 1 tab by mouth daily for 2 days Patient not taking: Reported on 06/22/2023 04/26/23   Trevor Iha, FNP  SPIRIVA HANDIHALER 18 MCG inhalation capsule 1 capsule daily.    [provider]  varenicline (CHANTIX) 1 MG tablet Take 1 mg by mouth 2 (two) times daily.    [provider]    Family History Family History  Problem Relation Age of Onset   Healthy Mother    Healthy Father  Social History Social History   Tobacco Use   Smoking status: Some Days    Packs/day: 0.50    Years: 28.00    Additional pack years: 0.00    Total pack years: 14.00    Types: Cigarettes   Smokeless tobacco: Never  Vaping Use   Vaping Use: Former  Substance Use Topics   Alcohol use: No   Drug use: No     Allergies   Other and Poison ivy extract   Review of Systems Review of Systems No sore throat + cough No pleuritic pain No wheezing + nasal congestion ? post-nasal drainage No sinus pain/pressure No itchy/red eyes No earache No hemoptysis No SOB ? fever, + chills/sweats +  nausea No vomiting No abdominal pain + diarrhea No urinary symptoms No skin rash + fatigue + myalgias + headache Used OTC meds (ibuprofen, Tylenol cold/flu) without relief   Physical Exam Triage Vital Signs ED Triage Vitals  Enc Vitals Group     BP 06/22/23 1452 124/79     Pulse Rate 06/22/23 1452 77     Resp 06/22/23 1452 16     Temp 06/22/23 1452 98.3 F (36.8 C)     Temp Source 06/22/23 1452 Oral     SpO2 06/22/23 1452 95 %     Weight 06/22/23 1455 189 lb (85.7 kg)     Height 06/22/23 1455 6\' 2"  (1.88 m)     Head Circumference --      Peak Flow --      Pain Score 06/22/23 1454 8     Pain Loc --      Pain Edu? --      Excl. in GC? --    No data found.  Updated Vital Signs BP 124/79 (BP Location: Left Arm)   Pulse 77   Temp 98.3 F (36.8 C) (Oral)   Resp 16   Ht 6\' 2"  (1.88 m)   Wt 85.7 kg   SpO2 95%   BMI 24.27 kg/m   Visual Acuity Right Eye Distance:   Left Eye Distance:   Bilateral Distance:    Right Eye Near:   Left Eye Near:    Bilateral Near:     Physical Exam Nursing notes and Vital Signs reviewed. Appearance:  Patient appears stated age, and in no acute distress Eyes:  Pupils are equal, round, and reactive to light and accomodation.  Extraocular movement is intact.  Conjunctivae are not inflamed  Ears:  Canals normal.  Tympanic membranes normal.  Nose:  Mildly congested turbinates.  No sinus tenderness.  Pharynx:  Normal Neck:  Supple.  Mildly enlarged lateral nodes are present, tender to palpation on the left.   Lungs:  Clear to auscultation.  Breath sounds are equal.  Moving air well. Heart:  Regular rate and rhythm without murmurs, rubs, or gallops.  Abdomen:  Nontender without masses or hepatosplenomegaly.  Bowel sounds are present.  No CVA or flank tenderness.  Extremities:  No edema.  Skin:  No rash present.   UC Treatments / Results  Labs (all labs ordered are listed, but only abnormal results are displayed) Labs Reviewed  POC  SARS CORONAVIRUS 2 AG -  ED - Abnormal; Notable for the following components:      Result Value   SARS Coronavirus 2 Ag Positive (*)    All other components within normal limits  POCT INFLUENZA A/B negative    EKG   Radiology No results found.  Procedures Procedures (including critical care  time)  Medications Ordered in UC Medications - No data to display  Initial Impression / Assessment and Plan / UC Course  I have reviewed the triage vital signs and the nursing notes.  Pertinent labs & imaging results that were available during my care of the patient were reviewed by me and considered in my medical decision making (see chart for details).    Patient has history of asthma and normal renal function. Begin Paxlovid 300/100. Followup with Family Doctor if not improved in five days.  Final Clinical Impressions(s) / UC Diagnoses   Final diagnoses:  COVID-19 virus infection     Discharge Instructions      Do not take sildenafil (Revatio) while taking Paxlovid. Take plain guaifenesin (1200mg  extended release tabs such as Mucinex) twice daily, with plenty of water, for cough and congestion.  Get adequate rest.   Try warm salt water gargles for sore throat.  Stop all antihistamines for now, and other non-prescription cough/cold preparations. May take Ibuprofen as needed.  Your COVID-19 test is positive.  Isolate yourself for five days from today.  At the end of five days you may end isolation if your symptoms have cleared or improved, and you have not had a fever for 24 hours. At this time you should wear a mask for five more days when you are around others.  If symptoms become significantly worse during the night or over the weekend, proceed to the local emergency room.                 ED Prescriptions     Medication Sig Dispense Auth. Provider   nirmatrelvir & ritonavir (PAXLOVID, 300/100,) 20 x 150 MG & 10 x 100MG  TBPK Take 3 tablets by mouth 2 (two) times  daily for 5 days. 30 tablet Lattie Haw, MD         Lattie Haw, MD 06/23/23 [phone]

## 2023-06-28 ENCOUNTER — Ambulatory Visit
Admission: EM | Admit: 2023-06-28 | Discharge: 2023-06-28 | Disposition: A | Discharge status: Home / Self Care | Payer: Medicaid Other | Attending: Family Medicine | Admitting: Family Medicine

## 2023-06-28 DIAGNOSIS — L237 Allergic contact dermatitis due to plants, except food: Secondary | ICD-10-CM

## 2023-06-28 MED ORDER — PREDNISONE 10 MG (21) PO TBPK: ORAL_TABLET | Freq: Every day | ORAL | 0 refills | Status: DC

## 2023-06-28 MED ORDER — METHYLPREDNISOLONE ACETATE 80 MG/ML IJ SUSP
80.0000 mg | Freq: Once | INTRAMUSCULAR | Status: AC
  Administered 2023-06-28: 80 mg via INTRAMUSCULAR

## 2023-06-28 NOTE — Discharge Instructions (Signed)
Try to keep cool May take Benadryl for itching Start oral prednisone tomorrow

## 2023-06-28 NOTE — ED Provider Notes (Signed)
[name]    CSN: [id number] Arrival date & time: 06/28/23  1820      History   Chief Complaint Chief Complaint  Patient presents with   Rash    HPI [name] is a 47 y.o. male.   HPI  Patient is well-known to me for recurring poison ivy, contact dermatitis.  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 No pertinent surgical history.     Home Medications    Prior to Admission medications   Medication Sig Start Date End Date Taking? Authorizing Provider  ALPRAZolam Prudy Feeler) 1 MG tablet Take by mouth. 05/20/21  Yes [provider]  diclofenac (VOLTAREN) 75 MG EC tablet Take 75 mg by mouth 2 (two) times daily.   Yes [provider]  ibuprofen (ADVIL) 800 MG tablet Take 1 tablet by mouth 3 (three) times daily. 10/11/22  Yes [provider]  predniSONE (STERAPRED UNI-PAK 21 TAB) 10 MG (21) TBPK tablet Take by mouth daily. Take 6 tabs by mouth daily  for 2 days, then 5 tabs for 2 days, then 4 tabs for 2 days, then 3 tabs for  2 days, 2 tabs for 2 days, then 1 tab by mouth daily for 2 days 06/28/23  Yes Eustace Moore, MD  [facility] HANDIHALER 18 MCG inhalation capsule 1 capsule daily.   Yes [provider]  varenicline (CHANTIX) 1 MG tablet Take 1 mg by mouth 2 (two) times daily.   Yes [provider]    Family History Family History  Problem Relation Age of Onset   Healthy Mother    Healthy Father     Social History Social History   Tobacco Use   Smoking status: Some Days    Packs/day: 0.50    Years: 28.00    Additional pack years: 0.00    Total pack years: 14.00    Types: Cigarettes   Smokeless tobacco: Never  Vaping Use   Vaping Use: Former  Substance Use Topics   Alcohol use: No   Drug use: No     Allergies   Other and Poison ivy extract   Review of Systems Review of Systems  See HPI Physical Exam Triage Vital Signs ED Triage Vitals  Enc Vitals Group     BP 06/28/23 1832 125/78     Pulse Rate 06/28/23 1832 97     Resp 06/28/23 1832 16     Temp 06/28/23 1832 98.1 F (36.7 C)     Temp Source 06/28/23 1832 Oral  SpO2 06/28/23 1832 98 %     Weight --      Height --      Head Circumference --      Peak Flow --      Pain Score 06/28/23 1902 0     Pain Loc --      Pain Edu? --      Excl. in GC? --    No data found.  Updated Vital Signs BP 125/78 (BP Location: Left Arm)   Pulse 97   Temp 98.1 F (36.7 C) (Oral)   Resp 16   SpO2 98%       Physical Exam Constitutional:      General: He is not in acute distress.    Appearance: He is well-developed and normal weight.  HENT:     Head: Normocephalic and atraumatic.  Eyes:     Conjunctiva/sclera: Conjunctivae normal.     Pupils: Pupils are equal, round, and reactive to light.  Cardiovascular:     Rate and Rhythm: Normal rate.  Pulmonary:     Effort: Pulmonary effort is normal. No respiratory distress.  Abdominal:     General: There is no distension.     Palpations: Abdomen is soft.   Musculoskeletal:        General: Normal range of motion.     Cervical back: Normal range of motion.  Skin:    General: Skin is warm and dry.     Comments: Patches of erythematous papules with clusters of vesicles  Neurological:     Mental Status: He is alert.      UC Treatments / Results  Labs (all labs ordered are listed, but only abnormal results are displayed) Labs Reviewed - No data to display  EKG   Radiology No results found.  Procedures Procedures (including critical care time)  Medications Ordered in UC Medications  methylPREDNISolone acetate (DEPO-MEDROL) injection 80 mg (80 mg Intramuscular Given 06/28/23 1900)    Initial Impression / Assessment and Plan / UC Course  I have reviewed the triage vital signs and the nursing notes.  Pertinent labs & imaging results that were available during my care of the patient were reviewed by me and considered in my medical decision making (see chart for details).     Final Clinical Impressions(s) / UC Diagnoses   Final diagnoses:  Allergic contact dermatitis due to plants, except food     Discharge Instructions      Try to keep cool May take Benadryl for itching Start oral prednisone tomorrow   ED Prescriptions     Medication Sig Dispense Auth. Provider   predniSONE (STERAPRED UNI-PAK 21 TAB) 10 MG (21) TBPK tablet Take by mouth daily. Take 6 tabs by mouth daily  for 2 days, then 5 tabs for 2 days, then 4 tabs for 2 days, then 3 tabs for 2 days, 2 tabs for 2 days, then 1 tab by mouth daily for 2 days 42 tablet Delton See Letta Pate, MD      PDMP not reviewed this encounter.   Eustace Moore, MD 06/28/23 1910

## 2023-06-28 NOTE — ED Triage Notes (Signed)
Pt presents to the office for rash all over his body. Pt thinks he has poison ivy.

## 2023-07-13 ENCOUNTER — Encounter: Payer: Self-pay | Admitting: Emergency Medicine

## 2023-07-13 ENCOUNTER — Ambulatory Visit
Admission: EM | Admit: 2023-07-13 | Discharge: 2023-07-13 | Disposition: A | Discharge status: Home / Self Care | Payer: Medicaid Other | Attending: Family Medicine | Admitting: Family Medicine

## 2023-07-13 ENCOUNTER — Ambulatory Visit (INDEPENDENT_AMBULATORY_CARE_PROVIDER_SITE_OTHER): Payer: Medicaid Other

## 2023-07-13 DIAGNOSIS — T148XXA Other injury of unspecified body region, initial encounter: Secondary | ICD-10-CM

## 2023-07-13 DIAGNOSIS — M25521 Pain in right elbow: Secondary | ICD-10-CM | POA: Diagnosis not present

## 2023-07-13 DIAGNOSIS — M25421 Effusion, right elbow: Secondary | ICD-10-CM

## 2023-07-13 DIAGNOSIS — W19XXXA Unspecified fall, initial encounter: Secondary | ICD-10-CM

## 2023-07-13 DIAGNOSIS — M546 Pain in thoracic spine: Secondary | ICD-10-CM

## 2023-07-13 MED ORDER — CEPHALEXIN 500 MG PO CAPS: 1000.0000 mg | ORAL_CAPSULE | Freq: Two times a day (BID) | ORAL | 0 refills | Status: AC

## 2023-07-13 MED ORDER — KETOROLAC TROMETHAMINE 60 MG/2ML IM SOLN
60.0000 mg | Freq: Once | INTRAMUSCULAR | Status: AC
Start: 2023-07-13 — End: 2023-07-13
  Administered 2023-07-13: 60 mg via INTRAMUSCULAR

## 2023-07-13 NOTE — ED Provider Notes (Signed)
[name]    CSN: [id number] Arrival date & time: 07/13/23  1212      History   Chief Complaint Chief Complaint  Patient presents with   Fall    HPI [name] is a 47 y.o. male.   HPI Very pleasant 47 year old male presents with right elbow/central upper back pain secondary to 5 feet fall 1-1/2 hours ago in [facility].  Patient reports accidentally hitting his back against a loading dock which had lifting gait was in downward position.  Patient has large skin avulsion of upper back.  Small right elbow nonbleeding laceration and small hematoma at right inferior lateral calf.  PMH significant for glaucoma and recent COVID-19.  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 No pertinent surgical history.     Home Medications    Prior to Admission medications   Medication Sig Start Date End Date Taking? Authorizing Provider  cephALEXin (KEFLEX) 500 MG capsule Take 2 capsules (1,000 mg total) by mouth 2 (two) times daily for 7 days. 07/13/23 07/20/23 Yes Trevor Iha, FNP  ALPRAZolam Prudy Feeler) 1 MG tablet Take by  mouth. 05/20/21   [provider]  diclofenac (VOLTAREN) 75 MG EC tablet Take 75 mg by mouth 2 (two) times daily.    [provider]  ibuprofen (ADVIL) 800 MG tablet Take 1 tablet by mouth 3 (three) times daily. 10/11/22   [provider]  predniSONE (STERAPRED UNI-PAK 21 TAB) 10 MG (21) TBPK tablet Take by mouth daily. Take 6 tabs by mouth daily  for 2 days, then 5 tabs for 2 days, then 4 tabs for 2 days, then 3 tabs for 2 days, 2 tabs for 2 days, then 1 tab by mouth daily for 2 days 06/28/23   Eustace Moore, MD  [facility] HANDIHALER 18 MCG inhalation capsule 1 capsule daily.    [provider]  varenicline (CHANTIX) 1 MG tablet Take 1 mg by mouth 2 (two) times daily.    [provider]    Family History Family History  Problem Relation Age of Onset   Healthy Mother    Healthy Father     Social History Social History   Tobacco Use   Smoking status: Some Days    Current packs/day: 0.50    Average packs/day: 0.5 packs/day for 28.0 years (14.0 ttl pk-yrs)    Types: Cigarettes   Smokeless tobacco: Never  Vaping Use   Vaping status: Former  Substance Use Topics   Alcohol use: No   Drug use: No     Allergies   Other and Poison ivy extract  Review of Systems Review of Systems  Musculoskeletal:        Right elbow pain central upper back pain secondary to fall 1.5 hours ago  Skin:  Positive for wound.       Large skin avulsion of upper back secondary to fall  All other systems reviewed and are negative.    Physical Exam Triage Vital Signs ED Triage Vitals  Encounter Vitals Group     BP 07/13/23 1225 (!) 130/90     Systolic BP Percentile --      Diastolic BP Percentile --      Pulse Rate 07/13/23 1225 78     Resp 07/13/23 1225 16     Temp 07/13/23 1225 98.3 F (36.8 C)     Temp Source 07/13/23 1225 Oral     SpO2 07/13/23 1225 99 %     Weight --      Height --      Head Circumference --      Peak Flow --      Pain  Score 07/13/23 1226 5     Pain Loc --      Pain Education --      Exclude from Growth Chart --    No data found.  Updated Vital Signs BP (!) 130/90 (BP Location: Left Arm)   Pulse 78   Temp 98.3 F (36.8 C) (Oral)   Resp 16   SpO2 99%      Physical Exam Vitals and nursing note reviewed.  Constitutional:      Appearance: Normal appearance. He is normal weight.  HENT:     Head: Normocephalic and atraumatic.     Mouth/Throat:     Mouth: Mucous membranes are moist.     Pharynx: Oropharynx is clear.  Eyes:     Extraocular Movements: Extraocular movements intact.     Conjunctiva/sclera: Conjunctivae normal.     Pupils: Pupils are equal, round, and reactive to light.  Cardiovascular:     Rate and Rhythm: Normal rate and regular rhythm.     Pulses: Normal pulses.     Heart sounds: Normal heart sounds.  Pulmonary:     Effort: Pulmonary effort is normal.     Breath sounds: Normal breath sounds. No wheezing, rhonchi or rales.  Musculoskeletal:        General: Normal range of motion.     Cervical back: Normal range of motion and neck supple.     Comments: Right elbow: TTP with mild soft tissue swelling noted over olecranon, full range of motion intact  Skin:    General: Skin is warm and dry.     Comments: Upper back (over thoracic spine): notable skin avulsion please see image below  Neurological:     General: No focal deficit present.     Mental Status: He is alert and oriented to person, place, and time. Mental status is at baseline.  Psychiatric:        Mood and Affect: Mood normal.        Behavior: Behavior normal.      UC Treatments / Results  Labs (all labs ordered are listed, but only abnormal results are displayed) Labs Reviewed - No data to display  EKG   Radiology DG Thoracic Spine 2 View  Result Date: 07/13/2023 CLINICAL DATA:  Trauma, fall EXAM: THORACIC SPINE 2 VIEWS COMPARISON:  None Available. FINDINGS: No fracture is seen. Alignment of posterior  margins of vertebral bodies appears normal. Small bony spurs are noted.  Paraspinal soft tissues are unremarkable. IMPRESSION: No fracture or dislocation is seen in thoracic spine. Degenerative changes with small bony spurs are noted. Electronically Signed   By: Ernie Avena [name]   On: 07/13/2023 13:44   DG Elbow Complete Right  Result Date: 07/13/2023 CLINICAL DATA:  Trauma, fall, pain EXAM: RIGHT ELBOW - COMPLETE 3+ VIEW COMPARISON:  None Available. FINDINGS: No fracture or dislocation is seen. There is soft tissue swelling over the olecranon process. There is no displacement of posterior fat pad. IMPRESSION: No fracture or dislocation is seen. There is soft tissue swelling over the olecranon process which may suggest contusion or incidental bursitis. Electronically Signed   By: Ernie Avena [name]   On: 07/13/2023 13:43    Procedures Procedures (including critical care time)  Medications Ordered in UC Medications  ketorolac (TORADOL) injection 60 mg (60 mg Intramuscular Given 07/13/23 1239)    Initial Impression / Assessment and Plan / UC Course  I have reviewed the triage vital signs and the nursing notes.  Pertinent labs & imaging results that were available during my care of the patient were reviewed by me and considered in my medical decision making (see chart for details).     MDM: 1.  Acute bilateral thoracic back pain-IM Toradol 60 mg given once in clinic and prior to discharge, x-ray of thoracic spine revealed above; 2.  Avulsion of skin-most notable of upper back-Rx'd Keflex 1 g twice daily x 7 days.  3.  Pain and swelling of elbow, right-right elbow x-ray revealed above.  Advised patient may RICE right elbow for 30 minutes 3 times daily for the next 3 days. 4.  Fall, initial encounter-patient reports accidentally falling off a trailer hook up platform/loading dock 1.5 hours ago. Advised patient of x-ray results with hardcopy provided.  Advised patient may RICE affected  area of right elbow for 30 minutes 3 times daily for the next 3 days.  Advised patient to take medication as directed with food to completion.  Encouraged increase daily water intake to 64 ounces per day while taking this medication.  Advised if symptoms worsen and/or unresolved please follow-up with PCP or here for further evaluation.  Patient discharged home, hemodynamically stable.   Final Clinical Impressions(s) / UC Diagnoses   Final diagnoses:  Fall, initial encounter  Avulsion of skin  Pain and swelling of elbow, right  Acute bilateral thoracic back pain     Discharge Instructions      Advised patient of x-ray results with hardcopy provided.  Advised patient may RICE affected area of right elbow for 30 minutes 3 times daily for the next 3 days.  Advised patient to take medication as directed with food to completion.  Encouraged increase daily water intake to 64 ounces per day while taking this medication.  Advised if symptoms worsen and/or unresolved please follow-up with PCP or here for further evaluation.     ED Prescriptions     Medication Sig Dispense Auth. Provider   cephALEXin (KEFLEX) 500 MG capsule Take 2 capsules (1,000 mg total) by mouth 2 (two) times daily for 7 days. 28 capsule Trevor Iha, FNP      PDMP not reviewed this encounter.   Trevor Iha, FNP 07/13/23 1416

## 2023-07-13 NOTE — ED Triage Notes (Signed)
Fell 5 feet in between a dock plate. Hit his back against the dock all the way down. Landed on feet and fell onto back. Knocked wind out of him, but was able to walk immediately after. Denies hitting head/neck, denies LOC, weakness, numbness, tingling. States pain was initially in thoracic spine, but now pain is mostly lower back. C/o right elbow pain and right leg pain. Superficial cut to posterior right elbow with bleeding controlled. Hematoma forming to lateral right calf.

## 2023-07-13 NOTE — Discharge Instructions (Addendum)
Advised patient of x-ray results with hardcopy provided.  Advised patient may RICE affected area of right elbow for 30 minutes 3 times daily for the next 3 days.  Advised patient to take medication as directed with food to completion.  Encouraged increase daily water intake to 64 ounces per day while taking this medication.  Advised if symptoms worsen and/or unresolved please follow-up with PCP or here for further evaluation.

## 2024-09-12 ENCOUNTER — Ambulatory Visit
Admission: EM | Admit: 2024-09-12 | Discharge: 2024-09-12 | Disposition: A | Discharge status: Home / Self Care | Attending: Family Medicine | Admitting: Family Medicine

## 2024-09-12 DIAGNOSIS — R42 Dizziness and giddiness: Secondary | ICD-10-CM

## 2024-09-12 MED ORDER — ONDANSETRON 8 MG PO TBDP
8.0000 mg | ORAL_TABLET | Freq: Once | ORAL | Status: AC
  Administered 2024-09-12: 8 mg via ORAL

## 2024-09-12 MED ORDER — MECLIZINE HCL 25 MG PO TABS: 25.0000 mg | ORAL_TABLET | Freq: Three times a day (TID) | ORAL | 0 refills | Status: AC | PRN

## 2024-09-12 MED ORDER — ONDANSETRON HCL 8 MG PO TABS: 8.0000 mg | ORAL_TABLET | Freq: Three times a day (TID) | ORAL | 0 refills | Status: AC | PRN

## 2024-09-12 NOTE — Discharge Instructions (Signed)
 I am including a copy of exercises to help with your vertigo Take Zofran  as needed for nausea Take Antivert  (meclizine ) as needed for dizziness.  This can cause you to be mildly drowsy Drink lots of fluids and rest See your doctor if not improved by next week

## 2024-09-12 NOTE — ED Provider Notes (Signed)
 [name]    CSN: [id number] Arrival date & time: 09/12/24  0835      History   Chief Complaint Chief Complaint  Patient presents with   Nausea   Dizziness   Headache    HPI [name] is a 48 y.o. male.   HPI  Patient is here complaining of dizziness, headache, ear pain.  He states that it is causing him to have a lot of nausea.  He states has been nonstop for the last couple of days but is worse today than usual.  As I review his medical record it appears he had a visit with his primary care doctor a few days ago for the same complaint.  He was referred to ENT.  In this note the primary care doctor mention he had ear pain and dizziness off and on for a year.  ENT appointment is not scheduled until October.  Patient states that he does feel worse today.  He states the nausea is severe.  Past Medical History:  Diagnosis Date   Anxiety    Asthma    COVID-19 04/2021   Depression    Glaucoma     Patient Active Problem List   Diagnosis Date Noted   ADHD (attention deficit hyperactivity disorder) 09/27/2020   Encounter for long-term (current) use of other medications 09/27/2020   Mild intermittent asthma 04/02/2020   Angular cheilitis with candidiasis 02/05/2020   Neck pain 12/04/2019   Opioid dependence on agonist therapy (HCC) 12/04/2019   Opioid use disorder 09/27/2019   Syncope 03/27/2019   Smoker 03/03/2018   Depression with anxiety 02/03/2018   Pulmonary nodules 02/03/2018   Chest wall pain 01/20/2018   Anxiety 08/27/2011   Decreased appetite 08/27/2011   Insomnia 08/27/2011   Rash 08/27/2011   Restless leg 08/27/2011   Gastroenteritis, non-infectious 06/08/2011   Asthma 05/20/2011   Unspecified asthma, uncomplicated 05/20/2011    History reviewed. No pertinent surgical history.     Home Medications    Prior to Admission medications   Medication Sig Start Date End Date Taking? Authorizing Provider  meclizine  (ANTIVERT ) 25 MG tablet  Take 1 tablet (25 mg total) by mouth 3 (three) times daily as needed for dizziness. 09/12/24  Yes Maranda Jamee Jacob, MD  ondansetron  (ZOFRAN ) 8 MG tablet Take 1 tablet (8 mg total) by mouth every 8 (eight) hours as needed for nausea or vomiting. 09/12/24  Yes Maranda Jamee Jacob, MD  ALPRAZolam (XANAX) 1 MG tablet Take by mouth. 05/20/21   [provider]  ibuprofen  (ADVIL ) 800 MG tablet Take 1 tablet by mouth 3 (three) times daily. 10/11/22   [provider]  SPIRIVA HANDIHALER 18 MCG inhalation capsule 1 capsule daily.    [provider]  varenicline  (CHANTIX ) 1 MG tablet Take 1 mg by mouth 2 (two) times daily.    [provider]    Family History Family History  Problem Relation Age of Onset   Healthy Mother    Healthy Father     Social History Social History   Tobacco Use   Smoking status: Some Days    Current packs/day: 0.50    Average packs/day: 0.5 packs/day for 28.0 years (14.0 ttl pk-yrs)    Types: Cigarettes   Smokeless tobacco: Never  Vaping Use   Vaping status: Former  Substance Use Topics   Alcohol use: No   Drug use: No     Allergies   Other and Poison ivy extract   Review  of Systems Review of Systems See HPI  Physical Exam Triage Vital Signs ED Triage Vitals  Encounter Vitals Group     BP 09/12/24 0845 130/85     Girls Systolic BP Percentile --      Girls Diastolic BP Percentile --      Boys Systolic BP Percentile --      Boys Diastolic BP Percentile --      Pulse Rate 09/12/24 0845 (!) 53     Resp 09/12/24 0845 20     Temp 09/12/24 0845 98 F (36.7 C)     Temp src --      SpO2 09/12/24 0845 97 %     Weight --      Height --      Head Circumference --      Peak Flow --      Pain Score 09/12/24 0848 6     Pain Loc --      Pain Education --      Exclude from Growth Chart --    No data found.  Updated Vital Signs BP 130/85   Pulse (!) 53   Temp 98 F (36.7 C)   Resp 20   SpO2 97%      Physical  Exam Constitutional:      General: He is not in acute distress.    Appearance: He is well-developed.  HENT:     Head: Normocephalic and atraumatic.     Right Ear: Tympanic membrane and ear canal normal.     Left Ear: Tympanic membrane and ear canal normal.     Nose: Nose normal. No congestion.     Mouth/Throat:     Mouth: Mucous membranes are moist.     Pharynx: No posterior oropharyngeal erythema.  Eyes:     Extraocular Movements:     Left eye: Nystagmus present.     Conjunctiva/sclera: Conjunctivae normal.     Pupils: Pupils are equal, round, and reactive to light.  Cardiovascular:     Rate and Rhythm: Normal rate and regular rhythm.     Heart sounds: Normal heart sounds.  Pulmonary:     Effort: Pulmonary effort is normal. No respiratory distress.     Breath sounds: Normal breath sounds.  Abdominal:     General: There is no distension.     Palpations: Abdomen is soft.  Musculoskeletal:        General: Normal range of motion.     Cervical back: Normal range of motion.  Lymphadenopathy:     Cervical: No cervical adenopathy.  Skin:    General: Skin is warm and dry.  Neurological:     General: No focal deficit present.     Mental Status: He is alert.      UC Treatments / Results  Labs (all labs ordered are listed, but only abnormal results are displayed) Labs Reviewed - No data to display  EKG   Radiology No results found.  Procedures Procedures (including critical care time)  Medications Ordered in UC Medications  ondansetron  (ZOFRAN -ODT) disintegrating tablet 8 mg (8 mg Oral Given 09/12/24 0906)    Initial Impression / Assessment and Plan / UC Course  I have reviewed the triage vital signs and the nursing notes.  Pertinent labs & imaging results that were available during my care of the patient were reviewed by me and considered in my medical decision making (see chart for details).     Patient has had migraines in the past and does  not feel like this  headache and spinning sensation are migraine related.  His examination is nonfocal.  Discussed Epley maneuvers to help with dizziness.  I support an ENT referral if his dizziness has been going on for a year.  Will treat him symptomatically with Zofran  and Antivert .  He is given a note for work Final Clinical Impressions(s) / UC Diagnoses   Final diagnoses:  Vertigo     Discharge Instructions      I am including a copy of exercises to help with your vertigo Take Zofran  as needed for nausea Take Antivert  (meclizine ) as needed for dizziness.  This can cause you to be mildly drowsy Drink lots of fluids and rest See your doctor if not improved by next week   ED Prescriptions     Medication Sig Dispense Auth. Provider   ondansetron  (ZOFRAN ) 8 MG tablet Take 1 tablet (8 mg total) by mouth every 8 (eight) hours as needed for nausea or vomiting. 20 tablet Maranda Jamee Jacob, MD   meclizine  (ANTIVERT ) 25 MG tablet Take 1 tablet (25 mg total) by mouth 3 (three) times daily as needed for dizziness. 30 tablet Maranda Jamee Jacob, MD      PDMP not reviewed this encounter.   Maranda Jamee Jacob, MD 09/12/24 [id number]

## 2024-09-12 NOTE — ED Triage Notes (Addendum)
 Has c/o nausea, dizziness, headache for 3-4 days. Since 0100 has been nonstop. Has right ear pain. Left ear also feels a little discomforted but is mostly to right ear. No fever. No otc meds. Both big toes feel like they are on fire.

## 2024-12-25 ENCOUNTER — Ambulatory Visit: Admission: EM | Admit: 2024-12-25 | Discharge: 2024-12-25 | Disposition: A | Discharge status: Home / Self Care
